# Patient Record
Sex: Female | Born: 1950 | Race: Black or African American | Hispanic: No | Smoking: Never smoker
Health system: Southern US, Community
[De-identification: ages and names within clinical notes are randomized; demographics above are authoritative.]

## PROBLEM LIST (undated history)

## (undated) DIAGNOSIS — G4730 Sleep apnea, unspecified: Secondary | ICD-10-CM

## (undated) DIAGNOSIS — E039 Hypothyroidism, unspecified: Secondary | ICD-10-CM

## (undated) DIAGNOSIS — I10 Essential (primary) hypertension: Secondary | ICD-10-CM

## (undated) DIAGNOSIS — I509 Heart failure, unspecified: Secondary | ICD-10-CM

## (undated) DIAGNOSIS — N189 Chronic kidney disease, unspecified: Secondary | ICD-10-CM

## (undated) DIAGNOSIS — E119 Type 2 diabetes mellitus without complications: Secondary | ICD-10-CM

## (undated) HISTORY — DX: Hypothyroidism, unspecified: E03.9

## (undated) HISTORY — DX: Heart failure, unspecified: I50.9

## (undated) HISTORY — DX: Essential (primary) hypertension: I10

## (undated) HISTORY — PX: HYSTEROSCOPY WITH D & C: SHX1775

## (undated) HISTORY — DX: Sleep apnea, unspecified: G47.30

## (undated) HISTORY — PX: CHOLECYSTECTOMY: SHX55

## (undated) HISTORY — PX: BREAST SURGERY: SHX581

## (undated) HISTORY — PX: VEIN SURGERY: SHX48

## (undated) HISTORY — DX: Chronic kidney disease, unspecified: N18.9

## (undated) HISTORY — PX: AV FISTULA PLACEMENT: SHX1204

## (undated) HISTORY — DX: Type 2 diabetes mellitus without complications: E11.9

---

## 1999-11-19 ENCOUNTER — Encounter: Admission: RE | Admit: 1999-11-19 | Discharge: 2000-02-17 | Payer: Self-pay | Admitting: Family Medicine

## 2011-09-30 DIAGNOSIS — R079 Chest pain, unspecified: Secondary | ICD-10-CM

## 2011-10-01 DIAGNOSIS — R0602 Shortness of breath: Secondary | ICD-10-CM

## 2019-06-28 ENCOUNTER — Ambulatory Visit: Payer: Medicare Other | Admitting: Orthopaedic Surgery

## 2019-06-28 ENCOUNTER — Encounter: Payer: Self-pay | Admitting: Orthopaedic Surgery

## 2019-06-28 ENCOUNTER — Ambulatory Visit: Payer: Medicare Other

## 2019-06-28 ENCOUNTER — Other Ambulatory Visit: Payer: Self-pay

## 2019-06-28 VITALS — Temp 97.9°F | Wt 340.0 lb

## 2019-06-28 DIAGNOSIS — M7062 Trochanteric bursitis, left hip: Secondary | ICD-10-CM

## 2019-06-28 DIAGNOSIS — M25552 Pain in left hip: Secondary | ICD-10-CM | POA: Diagnosis not present

## 2019-06-28 DIAGNOSIS — Z6841 Body Mass Index (BMI) 40.0 and over, adult: Secondary | ICD-10-CM

## 2019-06-28 NOTE — Progress Notes (Signed)
Subjective:    Patient ID: Julie [name], female    DOB: 21-Mar-1951, 69 y.o.   MRN: [id number]  HPI She has had pain in the left lateral hip for over a year.  She has no trauma.  The pain is deep and runs down partially on the left thigh. She has no redness.  She uses a cane.  She had x-rays last year at [facility].  She has notes from her family doctor which I have reviewed.   Review of Systems  Constitutional: Positive for activity change.  Musculoskeletal: Positive for arthralgias, back pain, gait problem and myalgias.  All other systems reviewed and are negative.  For Review of Systems, all other systems reviewed and are negative.  The following is a summary of the past history medically, past history surgically, known current medicines, social history and family history.  This information is gathered electronically by the computer from prior information and documentation.  I review this each visit and have found including this information at this point in the chart is beneficial and informative.   No past medical history on file.    Current Outpatient Medications on File Prior to Visit  Medication Sig Dispense Refill  . albuterol (PROVENTIL) (2.5 MG/3ML) 0.083% nebulizer solution Take 2.5 mg by nebulization every 6 (six) hours as needed for wheezing or shortness of breath.    Marland Kitchen aspirin EC 81 MG tablet Take 81 mg by mouth daily.    . calcitRIOL (ROCALTROL) 0.25 MCG capsule Take 0.25 mcg by mouth daily.    . cholecalciferol (VITAMIN D3) 25 MCG (1000 UNIT) tablet Take 1,000 Units by mouth daily.    . cyclobenzaprine (FLEXERIL) 5 MG tablet Take 5 mg by mouth 3 (three) times daily as needed for muscle spasms.    Marland Kitchen gabapentin (NEURONTIN) 100 MG capsule Take 100 mg by mouth 3 (three) times daily.    . hydrOXYzine (ATARAX/VISTARIL) 25 MG tablet Take 25 mg by mouth.    . insulin aspart (NOVOLOG) 100 UNIT/ML injection Inject into the skin 3 (three) times daily before meals.    .  lipase/protease/amylase (CREON) 12000 units CPEP capsule Take 10,440 Units by mouth.    . pantoprazole (PROTONIX) 40 MG tablet Take 40 mg by mouth daily.    . sodium bicarbonate 650 MG tablet Take 650 mg by mouth 4 (four) times daily.    Marland Kitchen torsemide (DEMADEX) 20 MG tablet Take 20 mg by mouth daily.     No current facility-administered medications on file prior to visit.    Social History   Socioeconomic History  . Marital status: Divorced    Spouse name: Not on file  . Number of children: Not on file  . Years of education: Not on file  . Highest education level: Not on file  Occupational History  . Not on file  Tobacco Use  . Smoking status: Current Every Day Smoker    Types: Cigarettes  . Smokeless tobacco: Never Used  Substance and Sexual Activity  . Alcohol use: Not on file  . Drug use: Not on file  . Sexual activity: Not on file  Other Topics Concern  . Not on file  Social History Narrative  . Not on file   Social Determinants of Health   Financial Resource Strain:   . Difficulty of Paying Living Expenses: Not on file  Food Insecurity:   . Worried About Charity fundraiser in the Last Year: Not on file  . Ran Out  of Food in the Last Year: Not on file  Transportation Needs:   . Lack of Transportation (Medical): Not on file  . Lack of Transportation (Non-Medical): Not on file  Physical Activity:   . Days of Exercise per Week: Not on file  . Minutes of Exercise per Session: Not on file  Stress:   . Feeling of Stress : Not on file  Social Connections:   . Frequency of Communication with Friends and Family: Not on file  . Frequency of Social Gatherings with Friends and Family: Not on file  . Attends Religious Services: Not on file  . Active Member of Clubs or Organizations: Not on file  . Attends Archivist Meetings: Not on file  . Marital Status: Not on file  Intimate Partner Violence:   . Fear of Current or Ex-Partner: Not on file  . Emotionally  Abused: Not on file  . Physically Abused: Not on file  . Sexually Abused: Not on file    History of heart disease in the family.  Temp 97.9 F (36.6 C)   Wt (!) 340 lb (154.2 kg)   There is no height or weight on file to calculate BMI.     Objective:   Physical Exam Vitals and nursing note reviewed.  Constitutional:      Appearance: She is well-developed.  HENT:     Head: Normocephalic and atraumatic.  Eyes:     Conjunctiva/sclera: Conjunctivae normal.     Pupils: Pupils are equal, round, and reactive to light.  Cardiovascular:     Rate and Rhythm: Normal rate and regular rhythm.  Pulmonary:     Effort: Pulmonary effort is normal.  Abdominal:     Palpations: Abdomen is soft.  Musculoskeletal:     Cervical back: Normal range of motion and neck supple.       Legs:  Skin:    General: Skin is warm and dry.  Neurological:     Mental Status: She is alert and oriented to person, place, and time.     Cranial Nerves: No cranial nerve deficit.     Motor: No abnormal muscle tone.     Coordination: Coordination normal.     Deep Tendon Reflexes: Reflexes are normal and symmetric. Reflexes normal.  Psychiatric:        Behavior: Behavior normal.        Thought Content: Thought content normal.        Judgment: Judgment normal.    X-rays were done of the pelvis, reported separately.       Assessment & Plan:   Encounter Diagnoses  Name Primary?  . Pain in left hip Yes  . Body mass index 45.0-49.9, adult ([place])   . Morbid obesity ([place])   . Trochanteric bursitis, left hip    PROCEDURE NOTE:  The patient request injection, verbal consent was obtained.  The left trochanteric area of the hip was prepped appropriately after time out was performed.   Sterile technique was observed and injection of 1 cc of Depo-Medrol 40 mg with several cc's of plain xylocaine. Anesthesia was provided by ethyl chloride and a 20-gauge needle was used to inject the hip area. The injection was  tolerated well.  A band aid dressing was applied.  The patient was advised to apply ice later today and tomorrow to the injection sight as needed.  Return in two weeks.  Call if any problem.  Precautions discussed.   Electronically Signed Sanjuana Kava, MD 3/9/20212:37 PM

## 2019-07-12 ENCOUNTER — Other Ambulatory Visit: Payer: Self-pay

## 2019-07-12 ENCOUNTER — Encounter: Payer: Self-pay | Admitting: Orthopaedic Surgery

## 2019-07-12 ENCOUNTER — Ambulatory Visit: Payer: Medicare Other | Admitting: Orthopaedic Surgery

## 2019-07-12 VITALS — BP 145/69 | HR 87 | Temp 98.1°F | Ht 68.0 in | Wt 332.0 lb

## 2019-07-12 DIAGNOSIS — M25552 Pain in left hip: Secondary | ICD-10-CM | POA: Diagnosis not present

## 2019-07-12 DIAGNOSIS — Z6841 Body Mass Index (BMI) 40.0 and over, adult: Secondary | ICD-10-CM

## 2019-07-12 NOTE — Progress Notes (Signed)
My hip is so much better  The injection to the left trochanteric area of the hip helped very much.  She has no pain today, normal gait.  NV intact.  Encounter Diagnoses  Name Primary?  . Pain in left hip Yes  . Body mass index 45.0-49.9, adult ([place])   . Morbid obesity ([place])    I will see as needed.  Call if any problem.  Precautions discussed.   Electronically Signed Sanjuana Kava, MD 3/23/20212:10 PM

## 2019-08-19 NOTE — Progress Notes (Signed)
Office Visit Note  Patient: Julie [name]             Date of Birth: 1950/11/02           MRN: [id number]             PCP: Rosita Fire, MD Referring: Rosita Fire, MD Visit Date: 08/26/2019 Occupation: @GUAROCC @  Subjective:  Pain in multiple joints.   History of Present Illness: Julie [name] is a 69 y.o. female seen in consultation per request of her PCP.  According to patient her symptoms started in 2008 when she was diagnosed with breast cancer she was started experiencing increased joint stiffness.  She states the joint pain and stiffness continued at about 2 years ago she was seen in the emergency room at [facility] where she was diagnosed with trochanteric bursitis.  She continues to have pain and discomfort in her knee joints, her ankles and her feet.  She also has intermittent swelling in her feet.  She states her muscles in her lower extremities are painful.  She occasionally have cramps in her upper extremities but no joint swelling.  She was recently seen by her orthopedic surgeon Dr. Luna Glasgow who gave her cortisone injection for trochanteric bursitis which was helpful.  There is no family history of autoimmune disease.  Activities of Daily Living:  Patient reports morning stiffness for 1 hour.   Patient Reports nocturnal pain.  Difficulty dressing/grooming: Denies Difficulty climbing stairs: Reports Difficulty getting out of chair: Reports Difficulty using hands for taps, buttons, cutlery, and/or writing: Reports  Review of Systems  Constitutional: Positive for fatigue. Negative for night sweats, weight gain and weight loss.  HENT: Negative for mouth sores, trouble swallowing, trouble swallowing, mouth dryness and nose dryness.   Eyes: Positive for dryness. Negative for pain, redness and visual disturbance.  Respiratory: Positive for shortness of breath. Negative for cough and difficulty breathing.        History of CHF  Cardiovascular: Negative for chest  pain, palpitations, hypertension, irregular heartbeat and swelling in legs/feet.  Gastrointestinal: Positive for constipation and diarrhea. Negative for blood in stool.  Endocrine: Negative for increased urination.  Genitourinary: Negative for difficulty urinating, painful urination and vaginal dryness.  Musculoskeletal: Positive for arthralgias, gait problem, joint pain, myalgias, muscle weakness, morning stiffness and myalgias. Negative for joint swelling and muscle tenderness.  Skin: Negative for color change, rash, hair loss, redness, skin tightness, ulcers and sensitivity to sunlight.  Allergic/Immunologic: Negative for susceptible to infections.  Neurological: Negative for dizziness, numbness, headaches, memory loss, night sweats and weakness.  Hematological: Negative for bruising/bleeding tendency and swollen glands.  Psychiatric/Behavioral: Positive for sleep disturbance. Negative for depressed mood and confusion. The patient is not nervous/anxious.     PMFS History:  Patient Active Problem List   Diagnosis Date Noted  . CKD (chronic kidney disease) stage 4, GFR 15-29 ml/min (HCC) 08/26/2019  . OSA (obstructive sleep apnea) 08/26/2019  . History of hypothyroidism 08/26/2019  . History of type 2 diabetes mellitus 08/26/2019  . Hypertensive heart disease with chronic systolic congestive heart failure ([place]) 08/26/2019  . Chronic systolic CHF (congestive heart failure) ([place]) 08/26/2019  . History of breast cancer 08/26/2019  . History of gastroesophageal reflux (GERD) 08/26/2019    Past Medical History:  Diagnosis Date  . CHF (congestive heart failure) ([place])   . CKD (chronic kidney disease)   . Diabetes mellitus without complication ([place])   . Hypertension   . Hypothyroidism   . Sleep  apnea     Family History  Problem Relation Age of Onset  . Diabetes Mother   . Diabetes Father   . Diabetes Brother   . Diabetes Sister   . Diabetes Sister   . Thyroid disease Sister   .  Diabetes Son   . Healthy Son   . Healthy Son   . Healthy Son   . Healthy Daughter    Past Surgical History:  Procedure Laterality Date  . BREAST SURGERY Bilateral    mastectomy   . CHOLECYSTECTOMY    . HYSTEROSCOPY WITH D & C     x2  . VEIN SURGERY Left    vein transferred    Social History   Social History Narrative  . Not on file    There is no immunization history on file for this patient.   Objective: Vital Signs: BP 133/71 (BP Location: Right Wrist, Patient Position: Sitting, Cuff Size: Normal)   Pulse 82   Resp 18   Ht 5\' 8"  (1.727 m)   Wt (!) 356 lb (161.5 kg)   BMI 54.13 kg/m    Physical Exam Vitals and nursing note reviewed.  Constitutional:      Appearance: She is well-developed.  HENT:     Head: Normocephalic and atraumatic.  Eyes:     Conjunctiva/sclera: Conjunctivae normal.  Cardiovascular:     Rate and Rhythm: Normal rate and regular rhythm.     Heart sounds: Normal heart sounds.  Pulmonary:     Effort: Pulmonary effort is normal.     Breath sounds: Normal breath sounds.  Abdominal:     General: Bowel sounds are normal.     Palpations: Abdomen is soft.  Musculoskeletal:     Cervical back: Normal range of motion.  Lymphadenopathy:     Cervical: No cervical adenopathy.  Skin:    General: Skin is warm and dry.     Capillary Refill: Capillary refill takes less than 2 seconds.  Neurological:     Mental Status: She is alert and oriented to person, place, and time.  Psychiatric:        Behavior: Behavior normal.      Musculoskeletal Exam: C-spine was in good range of motion.  Thoracic and lumbar spine were difficult to assess due to limited mobility.  Shoulder joints, elbow joints, wrist joints, MCPs, PIPs and DIPs with good range of motion with no synovitis.  Hip joints were difficult to assess but patient declined any discomfort in her hips.  She has discomfort in bilateral knee joints but no warmth swelling or effusion was noted.  Bilateral  ankle joints with good range of motion.  She has some tenderness at the base of her right MTP and midfoot.  Left foot did not show any synovitis or discomfort.  CDAI Exam: CDAI Score: -- Patient Global: --; Provider Global: -- Swollen: --; Tender: -- Joint Exam 08/26/2019   No joint exam has been documented for this visit   There is currently no information documented on the homunculus. Go to the Rheumatology activity and complete the homunculus joint exam.  Investigation: No additional findings.  Imaging: No results found.  Recent Labs: No results found for: WBC, HGB, PLT, NA, K, CL, CO2, GLUCOSE, BUN, CREATININE, BILITOT, ALKPHOS, AST, ALT, PROT, ALBUMIN, CALCIUM, GFRAA, QFTBGOLD, QFTBGOLDPLUS  Speciality Comments: No specialty comments available.  Procedures:  No procedures performed Allergies: Ace inhibitors and Lortab [hydrocodone-acetaminophen]   Assessment / Plan:     Visit Diagnoses: Chronic pain of  both knees -patient complains of pain and discomfort in her bilateral knee joints.  No warmth swelling or effusion was noted.  Plan: XR KNEE 3 VIEW RIGHT, XR KNEE 3 VIEW LEFT.  X-rays of bilateral knee joints were consistent with severe osteoarthritis and severe chondromalacia patella.  She is having problems with mobility due to severe osteoarthritis.  She will eventually need total knee replacement.  Pain in both feet -she complains of pain and discomfort in her feet.  She had tenderness on palpation of her right first MTP joint.  No synovitis was noted.  Plan: XR Foot 2 Views Right, XR Foot 2 Views Left,.  X-ray findings are consistent with osteoarthritis.  Rheumatoid factor, Cyclic citrul peptide antibody, IgG, 14-3-3 eta Protein, Uric acid, Sedimentation rate  Myalgia -she complains of increased muscle cramps and left thigh tenderness.  She was recently injected by her orthopedic surgeon for left trochanteric bursitis.  Plan: CK   She has multiple medical problems which are  listed as follows:   Chronic systolic CHF (congestive heart failure) ([place])  Hypertensive heart disease with chronic systolic congestive heart failure ([place])  History of type 2 diabetes mellitus  CKD (chronic kidney disease) stage 4, GFR 15-29 ml/min (HCC)  History of hypothyroidism  OSA (obstructive sleep apnea)  History of breast cancer - 2008, bilateral mastectomy status post chemotherapy.  Followed at [facility].  History of gastroesophageal reflux (GERD)  Secondary hyperparathyroidism ([place])  Anemia due to stage 5 chronic kidney disease, not on chronic dialysis ([place])  A-V fistula (HCC) - Left arm  Orders: Orders Placed This Encounter  Procedures  . XR KNEE 3 VIEW RIGHT  . XR KNEE 3 VIEW LEFT  . XR Foot 2 Views Right  . XR Foot 2 Views Left  . Rheumatoid factor  . Cyclic citrul peptide antibody, IgG  . 14-3-3 eta Protein  . Uric acid  . Sedimentation rate  . CK   No orders of the defined types were placed in this encounter.   Face-to-face time spent with patient was 45 minutes. Greater than 50% of time was spent in counseling and coordination of care.  Follow-Up Instructions: Return for pain in knees and feet.   Bo Merino, MD  Note - This record has been created using Editor, commissioning.  Chart creation errors have been sought, but may not always  have been located. Such creation errors do not reflect on  the standard of medical care.

## 2019-08-26 ENCOUNTER — Ambulatory Visit: Payer: Self-pay

## 2019-08-26 ENCOUNTER — Encounter (INDEPENDENT_AMBULATORY_CARE_PROVIDER_SITE_OTHER): Payer: Self-pay

## 2019-08-26 ENCOUNTER — Ambulatory Visit: Payer: Medicare Other | Admitting: Rheumatology

## 2019-08-26 ENCOUNTER — Other Ambulatory Visit: Payer: Self-pay

## 2019-08-26 ENCOUNTER — Encounter: Payer: Self-pay | Admitting: Rheumatology

## 2019-08-26 VITALS — BP 133/71 | HR 82 | Resp 18 | Ht 68.0 in | Wt 356.0 lb

## 2019-08-26 DIAGNOSIS — M79671 Pain in right foot: Secondary | ICD-10-CM | POA: Diagnosis not present

## 2019-08-26 DIAGNOSIS — I5022 Chronic systolic (congestive) heart failure: Secondary | ICD-10-CM

## 2019-08-26 DIAGNOSIS — M069 Rheumatoid arthritis, unspecified: Secondary | ICD-10-CM

## 2019-08-26 DIAGNOSIS — D631 Anemia in chronic kidney disease: Secondary | ICD-10-CM

## 2019-08-26 DIAGNOSIS — M25562 Pain in left knee: Secondary | ICD-10-CM

## 2019-08-26 DIAGNOSIS — G8929 Other chronic pain: Secondary | ICD-10-CM | POA: Diagnosis not present

## 2019-08-26 DIAGNOSIS — M791 Myalgia, unspecified site: Secondary | ICD-10-CM | POA: Diagnosis not present

## 2019-08-26 DIAGNOSIS — M79672 Pain in left foot: Secondary | ICD-10-CM | POA: Diagnosis not present

## 2019-08-26 DIAGNOSIS — M25561 Pain in right knee: Secondary | ICD-10-CM

## 2019-08-26 DIAGNOSIS — Z853 Personal history of malignant neoplasm of breast: Secondary | ICD-10-CM

## 2019-08-26 DIAGNOSIS — Z8719 Personal history of other diseases of the digestive system: Secondary | ICD-10-CM

## 2019-08-26 DIAGNOSIS — N184 Chronic kidney disease, stage 4 (severe): Secondary | ICD-10-CM

## 2019-08-26 DIAGNOSIS — G4733 Obstructive sleep apnea (adult) (pediatric): Secondary | ICD-10-CM

## 2019-08-26 DIAGNOSIS — N185 Chronic kidney disease, stage 5: Secondary | ICD-10-CM

## 2019-08-26 DIAGNOSIS — Z8639 Personal history of other endocrine, nutritional and metabolic disease: Secondary | ICD-10-CM

## 2019-08-26 DIAGNOSIS — I110 Hypertensive heart disease with heart failure: Secondary | ICD-10-CM

## 2019-08-26 DIAGNOSIS — I770 Arteriovenous fistula, acquired: Secondary | ICD-10-CM | POA: Insufficient documentation

## 2019-08-26 DIAGNOSIS — N2581 Secondary hyperparathyroidism of renal origin: Secondary | ICD-10-CM

## 2019-08-29 ENCOUNTER — Other Ambulatory Visit: Payer: Self-pay | Admitting: Rheumatology

## 2019-08-29 DIAGNOSIS — R700 Elevated erythrocyte sedimentation rate: Secondary | ICD-10-CM

## 2019-08-29 NOTE — Progress Notes (Signed)
Please add SPEP.  Patient has elevated sedimentation rate.  She did not have inflammatory arthritis on the examination.

## 2019-08-30 ENCOUNTER — Encounter: Payer: Self-pay | Admitting: Rheumatology

## 2019-08-30 NOTE — Telephone Encounter (Signed)
Scheduled for 08/31/2019 @ 2:00 pm.

## 2019-08-30 NOTE — Telephone Encounter (Signed)
Please schedule an earlier appointment.

## 2019-08-31 ENCOUNTER — Other Ambulatory Visit: Payer: Self-pay

## 2019-08-31 ENCOUNTER — Ambulatory Visit: Payer: Medicare Other | Admitting: Rheumatology

## 2019-08-31 ENCOUNTER — Encounter: Payer: Self-pay | Admitting: Rheumatology

## 2019-08-31 ENCOUNTER — Ambulatory Visit (HOSPITAL_COMMUNITY)
Admission: RE | Admit: 2019-08-31 | Discharge: 2019-08-31 | Disposition: A | Discharge status: Home / Self Care | Payer: Medicare Other | Source: Ambulatory Visit | Attending: Rheumatology | Admitting: Rheumatology

## 2019-08-31 VITALS — BP 106/77 | HR 101 | Resp 18 | Ht 68.0 in | Wt 362.0 lb

## 2019-08-31 DIAGNOSIS — Z853 Personal history of malignant neoplasm of breast: Secondary | ICD-10-CM

## 2019-08-31 DIAGNOSIS — M0579 Rheumatoid arthritis with rheumatoid factor of multiple sites without organ or systems involvement: Secondary | ICD-10-CM

## 2019-08-31 DIAGNOSIS — Z79899 Other long term (current) drug therapy: Secondary | ICD-10-CM

## 2019-08-31 DIAGNOSIS — R700 Elevated erythrocyte sedimentation rate: Secondary | ICD-10-CM

## 2019-08-31 DIAGNOSIS — I770 Arteriovenous fistula, acquired: Secondary | ICD-10-CM

## 2019-08-31 DIAGNOSIS — G4733 Obstructive sleep apnea (adult) (pediatric): Secondary | ICD-10-CM

## 2019-08-31 DIAGNOSIS — E790 Hyperuricemia without signs of inflammatory arthritis and tophaceous disease: Secondary | ICD-10-CM

## 2019-08-31 DIAGNOSIS — M19071 Primary osteoarthritis, right ankle and foot: Secondary | ICD-10-CM | POA: Diagnosis not present

## 2019-08-31 DIAGNOSIS — Z8639 Personal history of other endocrine, nutritional and metabolic disease: Secondary | ICD-10-CM

## 2019-08-31 DIAGNOSIS — N184 Chronic kidney disease, stage 4 (severe): Secondary | ICD-10-CM

## 2019-08-31 DIAGNOSIS — Z8719 Personal history of other diseases of the digestive system: Secondary | ICD-10-CM

## 2019-08-31 DIAGNOSIS — I5022 Chronic systolic (congestive) heart failure: Secondary | ICD-10-CM

## 2019-08-31 DIAGNOSIS — M791 Myalgia, unspecified site: Secondary | ICD-10-CM

## 2019-08-31 DIAGNOSIS — I110 Hypertensive heart disease with heart failure: Secondary | ICD-10-CM

## 2019-08-31 DIAGNOSIS — N185 Chronic kidney disease, stage 5: Secondary | ICD-10-CM

## 2019-08-31 DIAGNOSIS — N2581 Secondary hyperparathyroidism of renal origin: Secondary | ICD-10-CM

## 2019-08-31 DIAGNOSIS — M170 Bilateral primary osteoarthritis of knee: Secondary | ICD-10-CM

## 2019-08-31 DIAGNOSIS — D631 Anemia in chronic kidney disease: Secondary | ICD-10-CM

## 2019-08-31 DIAGNOSIS — M19072 Primary osteoarthritis, left ankle and foot: Secondary | ICD-10-CM

## 2019-08-31 MED ORDER — PREDNISONE 5 MG PO TABS
ORAL_TABLET | ORAL | 0 refills | Status: DC
Start: 2019-08-31 — End: 2020-08-15

## 2019-08-31 NOTE — Patient Instructions (Signed)
Abatacept solution for injection (subcutaneous or intravenous use) What is this medicine? ABATACEPT (a ba TA sept) is used to treat moderate to severe active rheumatoid arthritis or psoriatic arthritis in adults. This medicine is also used to treat juvenile idiopathic arthritis. This medicine may be used for other purposes; ask your health care provider or pharmacist if you have questions. COMMON BRAND NAME(S): Orencia What should I tell my health care provider before I take this medicine? They need to know if you have any of these conditions:  cancer  diabetes  hepatitis B or history of hepatitis B infection  immune system problems  infection or history of infection (especially a virus infection such as chickenpox, cold sores, or herpes)  lung or breathing problems, like chronic obstructive pulmonary disease (COPD)  recently received or scheduled to receive a vaccination  scheduled to have surgery  tuberculosis, a positive skin test for tuberculosis, or have recently been in close contact with someone who has tuberculosis  an unusual or allergic reaction to abatacept, other medicines, foods, dyes, or preservatives  pregnant or trying to get pregnant  breast-feeding How should I use this medicine? This medicine is for infusion into a vein or for injection under the skin. Infusions are given by a health care professional in a hospital or clinic setting. If you are to give your own medicine at home, you will be taught how to prepare and give this medicine under the skin. Use exactly as directed. Take your medicine at regular intervals. Do not take your medicine more often than directed. It is important that you put your used needles and syringes in a special sharps container. Do not put them in a trash can. If you do not have a sharps container, call your pharmacist or health care provider to get one. Talk to your pediatrician regarding the use of this medicine in children. While  infusions in a clinic may be prescribed for children as young as 2 years for selected conditions, precautions do apply. Overdosage: If you think you have taken too much of this medicine contact a poison control center or emergency room at once. NOTE: This medicine is only for you. Do not share this medicine with others. What if I miss a dose? This medicine is used once a week if given by injection under the skin. If you miss a dose, take it as soon as you can. If it is almost time for your next dose, take only that dose. Do not take double or extra doses. If you are to be given an infusion of this medicine, it is important not to miss your dose. Doses are usually every 4 weeks. Call your doctor or health care professional if you are unable to keep an appointment. What may interact with this medicine? Do not take this medicine with any of the following medications:  live vaccines This medicine may also interact with the following medications:  anakinra  baricitinib  canakinumab  medicines that lower your chance of fighting an infection  rituximab  TNF blockers such as adalimumab, certolizumab, etanercept, golimumab, infliximab  tocilizumab  tofacitinib  upadacitinib  ustekinumab This list may not describe all possible interactions. Give your health care provider a list of all the medicines, herbs, non-prescription drugs, or dietary supplements you use. Also tell them if you smoke, drink alcohol, or use illegal drugs. Some items may interact with your medicine. What should I watch for while using this medicine? Visit your doctor for regular checks on your   progress. Tell your doctor or health care professional if your symptoms do not start to get better or if they get worse. You will be tested for tuberculosis (TB) before you start this medicine. If your doctor prescribed any medicine for TB, you should start taking the TB medicine before starting this medicine. Make sure to finish the  full course of TB medicine. This medicine may increase your risk of getting an infection. Call your doctor or health care professional if you get fever, chills, or sore throat, or other symptoms of a cold or flu. Do not treat yourself. Try to avoid being around people who are sick. If you have diabetes and are getting this medicine in a vein, the infusion can give false high blood sugar readings on the day of your dose. This may happen if you use certain types of blood glucose tests. Your health care provider may tell you to use a different way to monitor your blood sugar levels. What side effects may I notice from receiving this medicine? Side effects that you should report to your doctor or health care professional as soon as possible:  allergic reactions like skin rash, itching or hives, swelling of the face, lips, or tongue  breathing problems  chest pain  dizziness  signs and symptoms of infection like fever; chills; cough; sore throat; pain or trouble passing urine  unusually weak or tired Side effects that usually do not require medical attention (report to your doctor or health care professional if they continue or are bothersome):  diarrhea  headache  nausea  pain, redness, or irritation at site where injected  stomach pain or upset This list may not describe all possible side effects. Call your doctor for medical advice about side effects. You may report side effects to FDA at 1-800-FDA-1088. Where should I keep my medicine? Infusions will be given in a hospital or clinic and will not be stored at home. Storage for syringes and autoinjectors stored at home: Keep out of the reach of children. Store in a refrigerator between 2 and 8 degrees C (36 and 46 degrees F). Keep this medicine in the original container. Protect from light. Do not freeze. Do not shake. Throw away any unused medicine after the expiration date. NOTE: This sheet is a summary. It may not cover all possible  information. If you have questions about this medicine, talk to your doctor, pharmacist, or health care provider.  2020 Elsevier/Gold Standard (2018-10-12 14:01:21)  

## 2019-08-31 NOTE — Progress Notes (Signed)
Office Visit Note  Patient: Julie [name]             Date of Birth: 08/12/1950           MRN: [id number]             PCP: Rosita Fire, MD Referring: Rosita Fire, MD Visit Date: 08/31/2019 Occupation: '@GUAROCC'$ @  Subjective:  Pain and swelling in joints.   History of Present Illness: Julie [name] is a 69 y.o. female with history of pain in multiple joints.  She states she has had intermittent swelling for the last few years since she has been retired.  She was seen today in urgent basis that she developed pain and swelling in her hand to the point she was having difficulty making a fist.  She states the hand swelling has been better but now she is having discomfort in her left foot.  She states she has taken prednisone in the past for these episodes and the joint symptoms get better.  Patient states usually she gets prednisone taper for these episodes.  Activities of Daily Living:  Patient reports morning stiffness for 24 hours.   Patient Reports nocturnal pain.  Difficulty dressing/grooming: Denies Difficulty climbing stairs: Reports Difficulty getting out of chair: Reports Difficulty using hands for taps, buttons, cutlery, and/or writing: Reports  Review of Systems  Constitutional: Positive for fatigue.  HENT: Positive for nose dryness. Negative for mouth sores and mouth dryness.   Eyes: Positive for dryness. Negative for itching.  Respiratory: Negative for shortness of breath and difficulty breathing.   Cardiovascular: Negative for chest pain and palpitations.  Gastrointestinal: Negative for blood in stool, constipation and diarrhea.  Endocrine: Negative for increased urination.  Genitourinary: Negative for difficulty urinating and painful urination.  Musculoskeletal: Positive for arthralgias, joint pain, joint swelling, myalgias, morning stiffness, muscle tenderness and myalgias.  Skin: Negative for rash, hair loss and redness.  Allergic/Immunologic: Negative for  susceptible to infections.  Neurological: Positive for weakness. Negative for dizziness, numbness, headaches and memory loss.  Hematological: Negative for bruising/bleeding tendency.  Psychiatric/Behavioral: Negative for confusion.    PMFS History:  Patient Active Problem List   Diagnosis Date Noted  . CKD (chronic kidney disease) stage 4, GFR 15-29 ml/min (HCC) 08/26/2019  . OSA (obstructive sleep apnea) 08/26/2019  . History of hypothyroidism 08/26/2019  . History of type 2 diabetes mellitus 08/26/2019  . Hypertensive heart disease with chronic systolic congestive heart failure ([place]) 08/26/2019  . Chronic systolic CHF (congestive heart failure) ([place]) 08/26/2019  . History of breast cancer 08/26/2019  . History of gastroesophageal reflux (GERD) 08/26/2019  . Secondary hyperparathyroidism ([place]) 08/26/2019  . Anemia due to stage 5 chronic kidney disease, not on chronic dialysis ([place]) 08/26/2019  . A-V fistula ([place]) 08/26/2019    Past Medical History:  Diagnosis Date  . CHF (congestive heart failure) ([place])   . CKD (chronic kidney disease)   . Diabetes mellitus without complication ([place])   . Hypertension   . Hypothyroidism   . Sleep apnea     Family History  Problem Relation Age of Onset  . Diabetes Mother   . Diabetes Father   . Diabetes Brother   . Diabetes Sister   . Diabetes Sister   . Thyroid disease Sister   . Diabetes Son   . Healthy Son   . Healthy Son   . Healthy Son   . Healthy Daughter    Past Surgical History:  Procedure Laterality Date  . BREAST  SURGERY Bilateral    mastectomy   . CHOLECYSTECTOMY    . HYSTEROSCOPY WITH D & C     x2  . VEIN SURGERY Left    vein transferred    Social History   Social History Narrative  . Not on file    There is no immunization history on file for this patient.   Objective: Vital Signs: BP 106/77 (BP Location: Left Wrist, Patient Position: Sitting, Cuff Size: Normal)   Pulse (!) 101   Resp 18   Ht '5\' 8"'$  (1.727  m)   Wt (!) 362 lb (164.2 kg)   BMI 55.04 kg/m    Physical Exam Vitals and nursing note reviewed.  Constitutional:      Appearance: She is well-developed.  HENT:     Head: Normocephalic and atraumatic.  Eyes:     Conjunctiva/sclera: Conjunctivae normal.  Cardiovascular:     Rate and Rhythm: Normal rate and regular rhythm.     Heart sounds: Normal heart sounds.  Pulmonary:     Effort: Pulmonary effort is normal.     Breath sounds: Normal breath sounds.  Abdominal:     General: Bowel sounds are normal.     Palpations: Abdomen is soft.  Musculoskeletal:     Cervical back: Normal range of motion.  Lymphadenopathy:     Cervical: No cervical adenopathy.  Skin:    General: Skin is warm and dry.     Capillary Refill: Capillary refill takes less than 2 seconds.  Neurological:     Mental Status: She is alert and oriented to person, place, and time.  Psychiatric:        Behavior: Behavior normal.      Musculoskeletal Exam: C-spine was in good range of motion.  Shoulder joints elbow joints wrist joints with good range of motion.  She had good range of motion of her wrist joints and MCP joints.  She is swelling over her right fifth PIP joint.  She has warmth and tenderness with range of motion of her bilateral knee joints.  She has swelling over her left ankle joint. CDAI Exam: CDAI Score: 5  Patient Global: 5 mm; Provider Global: 5 mm Swollen: 2 ; Tender: 4  Joint Exam 08/31/2019      Right  Left  PIP 5  Swollen Tender     Knee   Tender   Tender  Ankle     Swollen Tender     Investigation: No additional findings.  Imaging: XR Foot 2 Views Left  Result Date: 08/26/2019 First MTP, PIP and DIP narrowing was noted.  No intertarsal or tibiotalar joint space narrowing was noted.  Inferior calcaneal spur was noted.  No erosive changes were noted. Impression: These findings are consistent with osteoarthritis of the foot.  XR Foot 2 Views Right  Result Date: 08/26/2019 First MTP,  PIP and DIP narrowing was noted.  No intertarsal or tibiotalar joint space narrowing was noted.  Inferior calcaneal spur was noted.  No erosive changes were noted. Impression: These findings are consistent with osteoarthritis of the foot.  XR KNEE 3 VIEW LEFT  Result Date: 08/26/2019 Severe lateral compartment narrowing with lateral and intercondylar osteophytes was noted.  No chondrocalcinosis was noted.  Severe patellofemoral narrowing was noted. Impression: These findings are consistent with severe osteoarthritis and severe chondromalacia patella.  XR KNEE 3 VIEW RIGHT  Result Date: 08/26/2019 Severe lateral compartment narrowing with lateral and intercondylar osteophytes was noted.  No chondrocalcinosis was noted.  Severe patellofemoral  narrowing was noted. Impression: These findings are consistent with severe osteoarthritis and severe chondromalacia patella.   Recent Labs: No results found for: WBC, HGB, PLT, NA, K, CL, CO2, GLUCOSE, BUN, CREATININE, BILITOT, ALKPHOS, AST, ALT, PROT, ALBUMIN, CALCIUM, GFRAA, QFTBGOLD, QFTBGOLDPLUS   Aug 26, 2019 RF 21, anti-CCP negative, ESR 125, uric acid 9.8, CK 47  August 02, 2019 labs from [facility] CBC WBC 8.9 hemoglobin 10.7 platelets 266, CMP creatinine 4.2, glucose 182, protein 8.4, May 10, 2019 TSH normal  Speciality Comments: No specialty comments available.  Procedures:  No procedures performed Allergies: Ace inhibitors and Lortab [hydrocodone-acetaminophen]   Assessment / Plan:     Visit Diagnoses: Rheumatoid arthritis involving multiple sites with positive rheumatoid factor (HCC) - RF 21, anti-CCP negative, ESR 125 -patient gives history of intermittent swelling in multiple joints for the last few years.  The x-rays obtained at the last visit were unremarkable.  Her sed rate is 125 and rheumatoid factor is low titer positive.  Anti-CCP is negative.  She came today in urgent basis due to increased pain and swelling.  She  had swelling in multiple joints as described above.  I discussed the most likely diagnosis of rheumatoid arthritis.  We also discussed treatment options.  With her history of severe kidney disease and congestive heart failure the choices are very limited.  I will obtain screening labs today.  If all the labs are within normal limits then we may be able to start her on Orencia if she agrees.  She is taking the information with her.  She stated that her episodes are not so frequent and she is satisfied taking prednisone intermittently.  She is concerned about the increased risk of infection with Orencia.  Have given her a handout to think about it.  I will call in prednisone 20 mg which can be tapered by 5 mg every 4 days.  She has sliding scale and she states that she will be able to monitor her blood sugar and adjust the insulin dosage.  Plan: 14-3-3 eta Protein  High risk medication use - Plan: BASIC METABOLIC PANEL WITH GFR, Hepatitis A antibody, IgM, Hepatitis B core antibody, IgM, Hepatitis B surface antigen, HIV Antibody (routine testing w rflx), QuantiFERON-TB Gold Plus, Serum protein electrophoresis with reflex, IgG, IgA, IgM, Thiopurine methyltransferase(tpmt)rbc, DG Chest 2 View  Elevated sedimentation rate-I am uncertain about the true etiology of elevated sed rate of 125.  She does not have that much inflammation.  She also has anemia due to chronic kidney disease that may be elevating her sed rate.  I do not find any previous sed rate for comparison.  Dr. Legrand Rams was not available in the office today.  I will try to reach him tomorrow.  She may need work-up to evaluate elevated sedimentation rate.  Primary osteoarthritis of both feet-she did have osteoarthritic changes on her x-rays obtained last visit.  Today she presented with left ankle joint swelling.  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  She has difficulty with mobility.   She also has warmth and swelling in her knee joints.  Hyperuricemia - Uric acid 9.8.  She denies any history of gout.  Myalgia - CK was normal.  Hypertensive heart disease with chronic systolic congestive heart failure (HCC)  Chronic systolic CHF (congestive heart failure) (HCC)  History of type 2 diabetes mellitus  CKD (chronic kidney disease) stage 4, GFR 15-29 ml/min (HCC)  A-V fistula ([place])  Anemia due to stage 5 chronic kidney disease, not on chronic dialysis ([place])  History of breast cancer  Secondary hyperparathyroidism ([place])  History of gastroesophageal reflux (GERD)  History of hypothyroidism  OSA (obstructive sleep apnea)  Orders: Orders Placed This Encounter  Procedures  . DG Chest 2 View  . BASIC METABOLIC PANEL WITH GFR  . 14-3-3 eta Protein  . Hepatitis A antibody, IgM  . Hepatitis B core antibody, IgM  . Hepatitis B surface antigen  . HIV Antibody (routine testing w rflx)  . QuantiFERON-TB Gold Plus  . Serum protein electrophoresis with reflex  . IgG, IgA, IgM  . Thiopurine methyltransferase(tpmt)rbc   No orders of the defined types were placed in this encounter.   Face-to-face time spent with patient was 35 minutes. Greater than 50% of time was spent in counseling and coordination of care.  Follow-Up Instructions: Return for Rheumatoid arthritis.   Bo Merino, MD  Note - This record has been created using Editor, commissioning.  Chart creation errors have been sought, but may not always  have been located. Such creation errors do not reflect on  the standard of medical care.

## 2019-09-01 LAB — 14-3-3 ETA PROTEIN: 14-3-3 eta Protein: <0.2 ng/mL (ref <0.2)

## 2019-09-01 LAB — RHEUMATOID FACTOR: Rheumatoid fact SerPl-aCnc: 21 IU/mL — ABNORMAL HIGH (ref <14)

## 2019-09-01 LAB — URIC ACID: Uric Acid, Serum: 9.8 mg/dL — ABNORMAL HIGH (ref 2.5–7.0)

## 2019-09-01 LAB — CYCLIC CITRUL PEPTIDE ANTIBODY, IGG: Cyclic Citrullin Peptide Ab: <16 UNITS

## 2019-09-01 LAB — SEDIMENTATION RATE: Sed Rate: 125 mm/h — ABNORMAL HIGH (ref 0–30)

## 2019-09-01 LAB — CK: Total CK: 47 U/L (ref 29–143)

## 2019-09-02 NOTE — Progress Notes (Signed)
Rheumatoid factor is low titer positive, uric acid is elevated but she had no features of gout.

## 2019-09-02 NOTE — Progress Notes (Signed)
There is no active lung disease.  Mild heart enlargement is noted.  There is also prominence of pulmonary vessels.  She has history of congestive heart failure.  Please forward the report to her cardiologist and PCP.  Please notify patient of the x-ray results.

## 2019-09-05 ENCOUNTER — Telehealth: Payer: Self-pay | Admitting: Rheumatology

## 2019-09-05 NOTE — Telephone Encounter (Signed)
Dr. Legrand Rams returned my call.  I discussed elevated sedimentation rate of 125.  We discussed possible work-up including CT abdomen pelvis and total body bone scan.  He would prefer to wait at this time which I was in agreement and see if her sed rate goes down after her rheumatoid arthritis is better controlled.  We will also review the chart to see if she is up-to-date on mammogram and colonoscopy. Bo Merino, MD

## 2019-09-07 LAB — 14-3-3 ETA PROTEIN: 14-3-3 eta Protein: <0.2 ng/mL (ref <0.2)

## 2019-09-07 LAB — PROTEIN ELECTROPHORESIS, SERUM, WITH REFLEX
Albumin ELP: 3.5 g/dL — ABNORMAL LOW (ref 3.8–4.8)
Alpha 1: 0.3 g/dL (ref 0.2–0.3)
Alpha 2: 1.1 g/dL — ABNORMAL HIGH (ref 0.5–0.9)
Beta 2: 0.6 g/dL — ABNORMAL HIGH (ref 0.2–0.5)
Beta Globulin: 0.4 g/dL (ref 0.4–0.6)
Gamma Globulin: 1.7 g/dL (ref 0.8–1.7)
Total Protein: 7.6 g/dL (ref 6.1–8.1)

## 2019-09-07 LAB — HEPATITIS A ANTIBODY, IGM: Hep A IgM: NONREACTIVE

## 2019-09-07 LAB — HIV ANTIBODY (ROUTINE TESTING W REFLEX): HIV 1&2 Ab, 4th Generation: NONREACTIVE

## 2019-09-07 LAB — IGG, IGA, IGM
IgG (Immunoglobin G), Serum: 1811 mg/dL — ABNORMAL HIGH (ref 600–1540)
IgM, Serum: 190 mg/dL (ref 50–300)
Immunoglobulin A: 439 mg/dL — ABNORMAL HIGH (ref 70–320)

## 2019-09-07 LAB — BASIC METABOLIC PANEL WITH GFR
BUN/Creatinine Ratio: 17 (calc) (ref 6–22)
BUN: 79 mg/dL — ABNORMAL HIGH (ref 7–25)
CO2: 32 mmol/L (ref 20–32)
Calcium: 9.2 mg/dL (ref 8.6–10.4)
Chloride: 99 mmol/L (ref 98–110)
Creat: 4.65 mg/dL — ABNORMAL HIGH (ref 0.50–0.99)
GFR, Est African American: 10 mL/min/{1.73_m2} — ABNORMAL LOW (ref >=60)
GFR, Est Non African American: 9 mL/min/{1.73_m2} — ABNORMAL LOW (ref >=60)
Glucose, Bld: 212 mg/dL — ABNORMAL HIGH (ref 65–99)
Potassium: 5.2 mmol/L (ref 3.5–5.3)
Sodium: 140 mmol/L (ref 135–146)

## 2019-09-07 LAB — QUANTIFERON-TB GOLD PLUS
Mitogen-NIL: 7.3 IU/mL
NIL: 0.01 IU/mL
QuantiFERON-TB Gold Plus: NEGATIVE
TB1-NIL: 0 IU/mL
TB2-NIL: 0 IU/mL

## 2019-09-07 LAB — HEPATITIS B CORE ANTIBODY, IGM: Hep B C IgM: NONREACTIVE

## 2019-09-07 LAB — THIOPURINE METHYLTRANSFERASE (TPMT), RBC: Thiopurine Methyltransferase, RBC: 13 nmol/hr/mL RBC

## 2019-09-07 LAB — HEPATITIS B SURFACE ANTIGEN: Hepatitis B Surface Ag: NONREACTIVE

## 2019-09-15 NOTE — Progress Notes (Deleted)
Office Visit Note  Patient: Julie [name]             Date of Birth: 07-Feb-1951           MRN: [id number]             PCP: Rosita Fire, MD Referring: Rosita Fire, MD Visit Date: 09/29/2019 Occupation: @GUAROCC @  Subjective:  No chief complaint on file.   History of Present Illness: Julie [name] is a 69 y.o. female ***   Activities of Daily Living:  Patient reports morning stiffness for *** {minute/hour:19697}.   Patient {ACTIONS;DENIES/REPORTS:21021675::"Denies"} nocturnal pain.  Difficulty dressing/grooming: {ACTIONS;DENIES/REPORTS:21021675::"Denies"} Difficulty climbing stairs: {ACTIONS;DENIES/REPORTS:21021675::"Denies"} Difficulty getting out of chair: {ACTIONS;DENIES/REPORTS:21021675::"Denies"} Difficulty using hands for taps, buttons, cutlery, and/or writing: {ACTIONS;DENIES/REPORTS:21021675::"Denies"}  No Rheumatology ROS completed.   PMFS History:  Patient Active Problem List   Diagnosis Date Noted   CKD (chronic kidney disease) stage 4, GFR 15-29 ml/min (HCC) 08/26/2019   OSA (obstructive sleep apnea) 08/26/2019   History of hypothyroidism 08/26/2019   History of type 2 diabetes mellitus 08/26/2019   Hypertensive heart disease with chronic systolic congestive heart failure ([place]) 96/29/5284   Chronic systolic CHF (congestive heart failure) ([place]) 08/26/2019   History of breast cancer 08/26/2019   History of gastroesophageal reflux (GERD) 08/26/2019   Secondary hyperparathyroidism ([place]) 08/26/2019   Anemia due to stage 5 chronic kidney disease, not on chronic dialysis ([place]) 08/26/2019   A-V fistula ([place]) 08/26/2019    Past Medical History:  Diagnosis Date   CHF (congestive heart failure) (HCC)    CKD (chronic kidney disease)    Diabetes mellitus without complication ([place])    Hypertension    Hypothyroidism    Sleep apnea     Family History  Problem Relation Age of Onset   Diabetes Mother    Diabetes Father    Diabetes Brother      Diabetes Sister    Diabetes Sister    Thyroid disease Sister    Diabetes Son    Healthy Son    Healthy Son    Healthy Son    Healthy Daughter    Past Surgical History:  Procedure Laterality Date   BREAST SURGERY Bilateral    mastectomy    CHOLECYSTECTOMY     HYSTEROSCOPY WITH D & C     x2   VEIN SURGERY Left    vein transferred    Social History   Social History Narrative   Not on file    There is no immunization history on file for this patient.   Objective: Vital Signs: There were no vitals taken for this visit.   Physical Exam   Musculoskeletal Exam: ***  CDAI Exam: CDAI Score: -- Patient Global: --; Provider Global: -- Swollen: --; Tender: -- Joint Exam 09/29/2019   No joint exam has been documented for this visit   There is currently no information documented on the homunculus. Go to the Rheumatology activity and complete the homunculus joint exam.  Investigation: No additional findings.  Imaging: DG Chest 2 View  Result Date: 09/01/2019 CLINICAL DATA:  Shortness of breath on exertion EXAM: CHEST - 2 VIEW COMPARISON:  None. FINDINGS: There is mild cardiomegaly. Prominence of the central pulmonary vasculature is seen. No large airspace consolidation or pleural effusion. No acute osseous abnormality. IMPRESSION: Pulmonary vascular congestion and cardiomegaly Electronically Signed   By: Prudencio Pair M.D.   On: 09/01/2019 20:45   XR Foot 2 Views Left  Result Date: 08/26/2019 First MTP, PIP  and DIP narrowing was noted.  No intertarsal or tibiotalar joint space narrowing was noted.  Inferior calcaneal spur was noted.  No erosive changes were noted. Impression: These findings are consistent with osteoarthritis of the foot.  XR Foot 2 Views Right  Result Date: 08/26/2019 First MTP, PIP and DIP narrowing was noted.  No intertarsal or tibiotalar joint space narrowing was noted.  Inferior calcaneal spur was noted.  No erosive changes were noted.  Impression: These findings are consistent with osteoarthritis of the foot.  XR KNEE 3 VIEW LEFT  Result Date: 08/26/2019 Severe lateral compartment narrowing with lateral and intercondylar osteophytes was noted.  No chondrocalcinosis was noted.  Severe patellofemoral narrowing was noted. Impression: These findings are consistent with severe osteoarthritis and severe chondromalacia patella.  XR KNEE 3 VIEW RIGHT  Result Date: 08/26/2019 Severe lateral compartment narrowing with lateral and intercondylar osteophytes was noted.  No chondrocalcinosis was noted.  Severe patellofemoral narrowing was noted. Impression: These findings are consistent with severe osteoarthritis and severe chondromalacia patella.   Recent Labs: Lab Results  Component Value Date   NA 140 08/31/2019   K 5.2 08/31/2019   CL 99 08/31/2019   CO2 32 08/31/2019   GLUCOSE 212 (H) 08/31/2019   BUN 79 (H) 08/31/2019   CREATININE 4.65 (H) 08/31/2019   PROT 7.6 08/31/2019   CALCIUM 9.2 08/31/2019   GFRAA 10 (L) 08/31/2019   QFTBGOLDPLUS NEGATIVE 08/31/2019    Speciality Comments: No specialty comments available.  Procedures:  No procedures performed Allergies: Ace inhibitors and Lortab [hydrocodone-acetaminophen]   Assessment / Plan:     Visit Diagnoses: No diagnosis found.  Orders: No orders of the defined types were placed in this encounter.  No orders of the defined types were placed in this encounter.   Face-to-face time spent with patient was *** minutes. Greater than 50% of time was spent in counseling and coordination of care.  Follow-Up Instructions: No follow-ups on file.   Earnestine Mealing, CMA  Note - This record has been created using Editor, commissioning.  Chart creation errors have been sought, but may not always  have been located. Such creation errors do not reflect on  the standard of medical care.

## 2019-09-22 ENCOUNTER — Ambulatory Visit: Payer: Medicare Other | Admitting: Rheumatology

## 2019-09-26 ENCOUNTER — Ambulatory Visit: Payer: Medicare Other | Admitting: Rheumatology

## 2019-09-27 NOTE — Progress Notes (Signed)
Office Visit Note  Patient: Julie [name]             Date of Birth: Jun 06, 1950           MRN: [id number]             PCP: Rosita Fire, MD Referring: Rosita Fire, MD Visit Date: 09/28/2019 Occupation: _0 @  Subjective:  Discuss medication options   History of Present Illness: ROSALINE [name] is a 69 y.o. female with history of seropositive rheumatoid arthritis and osteoarthritis.  She reports her joint pain and inflammation improved while taking the prednisone taper prescribed on 08/31/19.  She continues to have intermittent pain in multiple joints if she is not taking prednisone. She has ongoing joint stiffness.  She would like to discuss treatment options to help her discomfort and stiffness so she does not have to continue taking prednisone. She states she took 2 tablets of prednisone yesterday.  She denies any joint pain or inflammation today.  She continues to use a cane to assist with ambulation.    Activities of Daily Living:  Patient reports morning stiffness for  5-10 minutes.   Patient Reports nocturnal pain.  Difficulty dressing/grooming: Denies Difficulty climbing stairs: Reports Difficulty getting out of chair: Reports Difficulty using hands for taps, buttons, cutlery, and/or writing: Denies  Review of Systems  Constitutional: Positive for fatigue.  HENT: Negative for mouth sores, mouth dryness and nose dryness.   Eyes: Positive for pain, itching and dryness. Negative for photophobia, redness and visual disturbance.  Respiratory: Positive for shortness of breath. Negative for cough, hemoptysis and difficulty breathing.   Cardiovascular: Negative for chest pain, palpitations, hypertension and swelling in legs/feet.  Gastrointestinal: Negative for blood in stool, constipation and diarrhea.  Endocrine: Negative for increased urination.  Genitourinary: Negative for difficulty urinating and painful urination.  Musculoskeletal: Positive for arthralgias, joint pain,  joint swelling and morning stiffness. Negative for myalgias, muscle weakness, muscle tenderness and myalgias.  Skin: Negative for color change, pallor, rash, hair loss, nodules/bumps, redness, skin tightness, ulcers and sensitivity to sunlight.  Allergic/Immunologic: Negative for susceptible to infections.  Neurological: Positive for dizziness. Negative for numbness, headaches, memory loss and weakness.  Hematological: Negative for bruising/bleeding tendency and swollen glands.  Psychiatric/Behavioral: Negative for depressed mood, confusion and sleep disturbance. The patient is not nervous/anxious.     PMFS History:  Patient Active Problem List   Diagnosis Date Noted  . CKD (chronic kidney disease) stage 4, GFR 15-29 ml/min (HCC) 08/26/2019  . OSA (obstructive sleep apnea) 08/26/2019  . History of hypothyroidism 08/26/2019  . History of type 2 diabetes mellitus 08/26/2019  . Hypertensive heart disease with chronic systolic congestive heart failure ([place]) 08/26/2019  . Chronic systolic CHF (congestive heart failure) ([place]) 08/26/2019  . History of breast cancer 08/26/2019  . History of gastroesophageal reflux (GERD) 08/26/2019  . Secondary hyperparathyroidism ([place]) 08/26/2019  . Anemia due to stage 5 chronic kidney disease, not on chronic dialysis ([place]) 08/26/2019  . A-V fistula ([place]) 08/26/2019    Past Medical History:  Diagnosis Date  . CHF (congestive heart failure) ([place])   . CKD (chronic kidney disease)   . Diabetes mellitus without complication ([place])   . Hypertension   . Hypothyroidism   . Sleep apnea     Family History  Problem Relation Age of Onset  . Diabetes Mother   . Diabetes Father   . Diabetes Brother   . Diabetes Sister   . Diabetes Sister   . Thyroid  disease Sister   . Diabetes Son   . Healthy Son   . Healthy Son   . Healthy Son   . Healthy Daughter    Past Surgical History:  Procedure Laterality Date  . BREAST SURGERY Bilateral    mastectomy   .  CHOLECYSTECTOMY    . HYSTEROSCOPY WITH D & C     x2  . VEIN SURGERY Left    vein transferred    Social History   Social History Narrative  . Not on file    There is no immunization history on file for this patient.   Objective: Vital Signs: BP 134/84 (BP Location: Right Arm, Patient Position: Sitting, Cuff Size: Large)   Pulse 96   Resp 18   Ht 5' 8" (1.727 m)   Wt (!) 369 lb (167.4 kg)   BMI 56.11 kg/m    Physical Exam Vitals and nursing note reviewed.  Constitutional:      Appearance: She is well-developed.  HENT:     Head: Normocephalic and atraumatic.  Eyes:     Conjunctiva/sclera: Conjunctivae normal.  Pulmonary:     Effort: Pulmonary effort is normal.  Abdominal:     General: Bowel sounds are normal.     Palpations: Abdomen is soft.  Musculoskeletal:     Cervical back: Normal range of motion.  Lymphadenopathy:     Cervical: No cervical adenopathy.  Skin:    General: Skin is warm and dry.     Capillary Refill: Capillary refill takes less than 2 seconds.  Neurological:     Mental Status: She is alert and oriented to person, place, and time.  Psychiatric:        Behavior: Behavior normal.      Musculoskeletal Exam: C-spine good ROM. Postural thoracic kyphosis. Shoulder joints, elbow joints, wrist joints, MCPs, PIPs, and DIPs good ROM with no synovitis. Complete fist formation bilaterally. Knee joints good ROM with no discomfort.  No warmth or effusion of knee joints. No tenderness of ankle joints.  No tenderness of MTP joints. Pedal edema noted bilaterally.   CDAI Exam: CDAI Score: -- Patient Global: --; Provider Global: -- Swollen: --; Tender: -- Joint Exam 09/28/2019   No joint exam has been documented for this visit   There is currently no information documented on the homunculus. Go to the Rheumatology activity and complete the homunculus joint exam.  Investigation: No additional findings.  Imaging: DG Chest 2 View  Result Date:  09/01/2019 CLINICAL DATA:  Shortness of breath on exertion EXAM: CHEST - 2 VIEW COMPARISON:  None. FINDINGS: There is mild cardiomegaly. Prominence of the central pulmonary vasculature is seen. No large airspace consolidation or pleural effusion. No acute osseous abnormality. IMPRESSION: Pulmonary vascular congestion and cardiomegaly Electronically Signed   By: Prudencio Pair M.D.   On: 09/01/2019 20:45    Recent Labs: Lab Results  Component Value Date   NA 140 08/31/2019   K 5.2 08/31/2019   CL 99 08/31/2019   CO2 32 08/31/2019   GLUCOSE 212 (H) 08/31/2019   BUN 79 (H) 08/31/2019   CREATININE 4.65 (H) 08/31/2019   PROT 7.6 08/31/2019   CALCIUM 9.2 08/31/2019   GFRAA 10 (L) 08/31/2019   QFTBGOLDPLUS NEGATIVE 08/31/2019    Speciality Comments: No specialty comments available.  Procedures:  No procedures performed Allergies: Ace inhibitors and Lortab [hydrocodone-acetaminophen]   Assessment / Plan:     Visit Diagnoses: Rheumatoid arthritis involving multiple sites with positive rheumatoid factor (HCC) -  RF  21, anti-CCP negative, ESR 125 -patient gives history of intermittent swelling in multiple joints for the last few years: X-rays were unremarkable.  She does not have any tenderness or synovitis on examination today.  She was prescribed a prednisone taper on 08/31/2019 after presenting with a flare.  She completed the prednisone taper and noted significant improvement in her joint pain and inflammation.  She continues to take prednisone as needed prescribed by her PCP.  According to the patient while taking prednisone she does not have any joint pain and is apprehensive to start on any immunosuppressive agents at this time.  Her treatment options are limited due to her history of severe kidney disease and congestive heart failure.  The indications, contraindications, potential side effects of Orencia were discussed today.  All questions were addressed.  She would like to review the handout on  her own and think about this decision before starting.  She was advised to notify us if she would like [place] to apply for Orencia 125 mg subcutaneous injections once weekly.  She will follow up as needed.   Medication counseling:  TB Gold: negative on 08/31/19 Hepatitis panel: Negative on 08/31/19 HIV: Negative on 08/31/19 SPEP: No abnormal protein bands are apparent on 08/31/19 Immunoglobulins: IgA and IgG elevated on 08/31/19  Does patient have a diagnosis of COPD? No  Counseled patient that Maureen Chatters is a selective T-cell costimulation blocker indicated for rheumatoid arthritis.  Counseled patient on purpose, proper use, and adverse effects of Orencia. The most common adverse effects are increased risk of infections, headache, and injection site reactions.  There is the possibility of an increased risk of malignancy but it is not well understood if this increased risk is due to the medication or the disease state.  Reviewed the importance of regular labs while on Orencia therapy.  Counseled patient that Maureen Chatters should be held prior to scheduled surgery.  Counseled patient to avoid live vaccines while on Orencia.  Advised patient to get annual influenza vaccine and the pneumococcal vaccine as indicated.  Provided patient with medication education material and answered all questions.  Patient consented to [facility].  Will upload consent into patient's chart.  Will apply for Orencia through patient's insurance.  Reviewed storage information for Orencia.  Advised initial injection must be administered in office.    High risk medication use - Discussed Orencia 125 mg sq injections once weekly.  She is not a good candidate for anti-TNF inhibitors due to her history of congestive heart failure.  She also has severe stage V chronic kidney disease so her treatment options are limited.  Elevated sedimentation rate: ESR was 125 on 08/31/2019.  Dr. Estanislado Pandy discussed the elevated sed rate with her PCP Dr. Legrand Rams.  Dr. Delma Freeze  were suggested a possible work-up with CT abdomen and pelvis and a total body bone scan with Dr. Legrand Rams suggested waiting at this time.  She has had an updated colonoscopy and mammogram.  Primary osteoarthritis of both feet: She is not having any discomfort in her feet at this time.  She has severe pedal edema bilaterally.  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  She has intermittent discomfort in both knee joints.  No warmth or effusion was noted.  She uses a cane to assist with ambulation.  Hyperuricemia - Uric acid 9.8.  She denies any history of gout.  Myalgia - CK was normal. She is not having any myalgias at this time.   Other medical  conditions are listed as follows:   Chronic systolic CHF (congestive heart failure) (HCC)  Hypertensive heart disease with chronic systolic congestive heart failure (HCC)  CKD (chronic kidney disease) stage 4, GFR 15-29 ml/min (HCC)  History of type 2 diabetes mellitus  Secondary hyperparathyroidism ([place])  Anemia due to stage 5 chronic kidney disease, not on chronic dialysis ([place])  A-V fistula (HCC)  History of breast cancer  History of hypothyroidism  History of gastroesophageal reflux (GERD)  OSA (obstructive sleep apnea)  Orders: No orders of the defined types were placed in this encounter.  No orders of the defined types were placed in this encounter.   Face-to-face time spent with patient was 30 minutes. Greater than 50% of time was spent in counseling and coordination of care.  Follow-Up Instructions: Return if symptoms worsen or fail to improve, for Rheumatoid arthritis.   Ofilia Neas, PA-C  I examined and evaluated the patient with Hazel Sams PA.  Patient has been taking prednisone on as needed basis.  She states she has done it for many years and it has worked well for her.  We are detailed discussion regarding different treatment options.  Due to her  complicated medical history she does not have very many choices.  We had detailed discussion regarding Orencia and its side effects.  Patient is not convinced to go on a disease modifying agent or a biologic agent.  There is nothing much to offer from a rheumatologist point of view.  She has been getting prednisone from her PCP and can continue to do so.  I advised her to come back only if she needs more aggressive therapy.  At this time she is not interested in coming for follow-up visits.  The plan of care was discussed as noted above.  Bo Merino, MD  Note - This record has been created using Editor, commissioning.  Chart creation errors have been sought, but may not always  have been located. Such creation errors do not reflect on  the standard of medical care.

## 2019-09-28 ENCOUNTER — Other Ambulatory Visit: Payer: Self-pay

## 2019-09-28 ENCOUNTER — Ambulatory Visit: Payer: Medicare Other | Admitting: Rheumatology

## 2019-09-28 ENCOUNTER — Encounter: Payer: Self-pay | Admitting: Rheumatology

## 2019-09-28 VITALS — BP 134/84 | HR 96 | Resp 18 | Ht 68.0 in | Wt 369.0 lb

## 2019-09-28 DIAGNOSIS — E790 Hyperuricemia without signs of inflammatory arthritis and tophaceous disease: Secondary | ICD-10-CM

## 2019-09-28 DIAGNOSIS — R700 Elevated erythrocyte sedimentation rate: Secondary | ICD-10-CM | POA: Diagnosis not present

## 2019-09-28 DIAGNOSIS — M19072 Primary osteoarthritis, left ankle and foot: Secondary | ICD-10-CM

## 2019-09-28 DIAGNOSIS — Z8719 Personal history of other diseases of the digestive system: Secondary | ICD-10-CM

## 2019-09-28 DIAGNOSIS — D631 Anemia in chronic kidney disease: Secondary | ICD-10-CM

## 2019-09-28 DIAGNOSIS — M791 Myalgia, unspecified site: Secondary | ICD-10-CM

## 2019-09-28 DIAGNOSIS — N185 Chronic kidney disease, stage 5: Secondary | ICD-10-CM

## 2019-09-28 DIAGNOSIS — G4733 Obstructive sleep apnea (adult) (pediatric): Secondary | ICD-10-CM

## 2019-09-28 DIAGNOSIS — Z79899 Other long term (current) drug therapy: Secondary | ICD-10-CM

## 2019-09-28 DIAGNOSIS — I770 Arteriovenous fistula, acquired: Secondary | ICD-10-CM

## 2019-09-28 DIAGNOSIS — N2581 Secondary hyperparathyroidism of renal origin: Secondary | ICD-10-CM

## 2019-09-28 DIAGNOSIS — I5022 Chronic systolic (congestive) heart failure: Secondary | ICD-10-CM

## 2019-09-28 DIAGNOSIS — M0579 Rheumatoid arthritis with rheumatoid factor of multiple sites without organ or systems involvement: Secondary | ICD-10-CM | POA: Diagnosis not present

## 2019-09-28 DIAGNOSIS — M170 Bilateral primary osteoarthritis of knee: Secondary | ICD-10-CM

## 2019-09-28 DIAGNOSIS — Z853 Personal history of malignant neoplasm of breast: Secondary | ICD-10-CM

## 2019-09-28 DIAGNOSIS — M19071 Primary osteoarthritis, right ankle and foot: Secondary | ICD-10-CM

## 2019-09-28 DIAGNOSIS — N184 Chronic kidney disease, stage 4 (severe): Secondary | ICD-10-CM

## 2019-09-28 DIAGNOSIS — Z8639 Personal history of other endocrine, nutritional and metabolic disease: Secondary | ICD-10-CM

## 2019-09-28 DIAGNOSIS — I110 Hypertensive heart disease with heart failure: Secondary | ICD-10-CM

## 2019-09-28 NOTE — Progress Notes (Signed)
Pharmacy Note  Subjective: Patient presents today to [facility] for follow up office visit.   Patient seen by the pharmacist for counseling on subcutaneous Orencia rheumatoid arthritis.  Patient has CHF making her a poor candidate for anti-TNF therapy.  Prior therapy includes: prednisone only.  Objective: CBC No results found for: WBC, RBC, HGB, HCT, PLT, MCV, MCH, MCHC, RDW, LYMPHSABS, MONOABS, EOSABS, BASOSABS  CMP     Component Value Date/Time   NA 140 08/31/2019 1445   K 5.2 08/31/2019 1445   CL 99 08/31/2019 1445   CO2 32 08/31/2019 1445   GLUCOSE 212 (H) 08/31/2019 1445   BUN 79 (H) 08/31/2019 1445   CREATININE 4.65 (H) 08/31/2019 1445   CALCIUM 9.2 08/31/2019 1445   PROT 7.6 08/31/2019 1445   GFRNONAA 9 (L) 08/31/2019 1445   GFRAA 10 (L) 08/31/2019 1445    Baseline Immunosuppressant Therapy Labs TB GOLD Quantiferon TB Gold Latest Ref Rng & Units 08/31/2019  Quantiferon TB Gold Plus NEGATIVE NEGATIVE   Hepatitis Panel Hepatitis Latest Ref Rng & Units 08/31/2019  Hep B Surface Ag NON-REACTI NON-REACTIVE  Hep B IgM NON-REACTI NON-REACTIVE  Hep A IgM NON-REACTI NON-REACTIVE   HIV Lab Results  Component Value Date   HIV NON-REACTIVE 08/31/2019   Immunoglobulins Immunoglobulin Electrophoresis Latest Ref Rng & Units 08/31/2019  IgA  70 - 320 mg/dL 439(H)  IgG 600 - 1,540 mg/dL 1,811(H)  IgM 50 - 300 mg/dL 190   SPEP Serum Protein Electrophoresis Latest Ref Rng & Units 08/31/2019  Total Protein 6.1 - 8.1 g/dL 7.6  Albumin 3.8 - 4.8 g/dL 3.5(L)  Alpha-1 0.2 - 0.3 g/dL 0.3  Alpha-2 0.5 - 0.9 g/dL 1.1(H)  Beta Globulin 0.4 - 0.6 g/dL 0.4  Beta 2 0.2 - 0.5 g/dL 0.6(H)  Gamma Globulin 0.8 - 1.7 g/dL 1.7   G6PD No results found for: G6PDH TPMT Lab Results  Component Value Date   TPMT 13 08/31/2019     Chest x-ray: Pulmonary vascular congestion and cardiomegaly 09/01/2019  Does patient have a diagnosis of COPD? No  Does patient have history of  diverticulitis?  No  Assessment/Plan:  Counseled patient that Maureen Chatters is a selective T-cell costimulation blocker.  Counseled patient on purpose, proper use, and adverse effects of Orencia. The most common adverse effects are increased risk of infections, headache, and injection site reactions.  There is the possibility of an increased risk of malignancy but it is not well understood if this increased risk is due to the medication or the disease state. Reviewed risk of GI perforation which is higher in patients with diverticulitis and diabetes.  Counseled patient that Maureen Chatters should be held prior to scheduled surgery.  Counseled patient to avoid live vaccines while on Orencia.  Recommend annual influenza, Pneumovax 23, Prevnar 13, and Shingrix as indicated.   Reviewed the importance of regular labs while on Orencia therapy. Patient will be due for labs 1 month after starting therapy. Standing orders placed. Provided patient with medication education material and answered all questions.  Patient dose will be Orencia 125 mg every 7 days.    Patient declined therapy at this time.  She was given educational handouts and will review and call the office if she decides to start therapy.  Advised that she may need to apply for patient assistance or research receiving infusions through the medical benefit.  Patient verbalized understanding.  All questions encouraged and answered.  Instructed patient to call with any further questions or concerns.  Mariella Saa, PharmD, [place], CPP Clinical Specialty Pharmacist (Rheumatology and Pulmonology)  09/28/2019 10:59 AM

## 2019-09-28 NOTE — Patient Instructions (Signed)
Abatacept solution for injection (subcutaneous or intravenous use) What is this medicine? ABATACEPT (a ba TA sept) is used to treat moderate to severe active rheumatoid arthritis or psoriatic arthritis in adults. This medicine is also used to treat juvenile idiopathic arthritis. This medicine may be used for other purposes; ask your health care provider or pharmacist if you have questions. COMMON BRAND NAME(S): Orencia What should I tell my health care provider before I take this medicine? They need to know if you have any of these conditions:  cancer  diabetes  hepatitis B or history of hepatitis B infection  immune system problems  infection or history of infection (especially a virus infection such as chickenpox, cold sores, or herpes)  lung or breathing problems, like chronic obstructive pulmonary disease (COPD)  recently received or scheduled to receive a vaccination  scheduled to have surgery  tuberculosis, a positive skin test for tuberculosis, or have recently been in close contact with someone who has tuberculosis  an unusual or allergic reaction to abatacept, other medicines, foods, dyes, or preservatives  pregnant or trying to get pregnant  breast-feeding How should I use this medicine? This medicine is for infusion into a vein or for injection under the skin. Infusions are given by a health care professional in a hospital or clinic setting. If you are to give your own medicine at home, you will be taught how to prepare and give this medicine under the skin. Use exactly as directed. Take your medicine at regular intervals. Do not take your medicine more often than directed. It is important that you put your used needles and syringes in a special sharps container. Do not put them in a trash can. If you do not have a sharps container, call your pharmacist or health care provider to get one. Talk to your pediatrician regarding the use of this medicine in children. While  infusions in a clinic may be prescribed for children as young as 2 years for selected conditions, precautions do apply. Overdosage: If you think you have taken too much of this medicine contact a poison control center or emergency room at once. NOTE: This medicine is only for you. Do not share this medicine with others. What if I miss a dose? This medicine is used once a week if given by injection under the skin. If you miss a dose, take it as soon as you can. If it is almost time for your next dose, take only that dose. Do not take double or extra doses. If you are to be given an infusion of this medicine, it is important not to miss your dose. Doses are usually every 4 weeks. Call your doctor or health care professional if you are unable to keep an appointment. What may interact with this medicine? Do not take this medicine with any of the following medications:  live vaccines This medicine may also interact with the following medications:  anakinra  baricitinib  canakinumab  medicines that lower your chance of fighting an infection  rituximab  TNF blockers such as adalimumab, certolizumab, etanercept, golimumab, infliximab  tocilizumab  tofacitinib  upadacitinib  ustekinumab This list may not describe all possible interactions. Give your health care provider a list of all the medicines, herbs, non-prescription drugs, or dietary supplements you use. Also tell them if you smoke, drink alcohol, or use illegal drugs. Some items may interact with your medicine. What should I watch for while using this medicine? Visit your doctor for regular checks on your   progress. Tell your doctor or health care professional if your symptoms do not start to get better or if they get worse. You will be tested for tuberculosis (TB) before you start this medicine. If your doctor prescribed any medicine for TB, you should start taking the TB medicine before starting this medicine. Make sure to finish the  full course of TB medicine. This medicine may increase your risk of getting an infection. Call your doctor or health care professional if you get fever, chills, or sore throat, or other symptoms of a cold or flu. Do not treat yourself. Try to avoid being around people who are sick. If you have diabetes and are getting this medicine in a vein, the infusion can give false high blood sugar readings on the day of your dose. This may happen if you use certain types of blood glucose tests. Your health care provider may tell you to use a different way to monitor your blood sugar levels. What side effects may I notice from receiving this medicine? Side effects that you should report to your doctor or health care professional as soon as possible:  allergic reactions like skin rash, itching or hives, swelling of the face, lips, or tongue  breathing problems  chest pain  dizziness  signs and symptoms of infection like fever; chills; cough; sore throat; pain or trouble passing urine  unusually weak or tired Side effects that usually do not require medical attention (report to your doctor or health care professional if they continue or are bothersome):  diarrhea  headache  nausea  pain, redness, or irritation at site where injected  stomach pain or upset This list may not describe all possible side effects. Call your doctor for medical advice about side effects. You may report side effects to FDA at 1-800-FDA-1088. Where should I keep my medicine? Infusions will be given in a hospital or clinic and will not be stored at home. Storage for syringes and autoinjectors stored at home: Keep out of the reach of children. Store in a refrigerator between 2 and 8 degrees C (36 and 46 degrees F). Keep this medicine in the original container. Protect from light. Do not freeze. Do not shake. Throw away any unused medicine after the expiration date. NOTE: This sheet is a summary. It may not cover all possible  information. If you have questions about this medicine, talk to your doctor, pharmacist, or health care provider.  2020 Elsevier/Gold Standard (2018-10-12 14:01:21)  

## 2019-09-29 ENCOUNTER — Ambulatory Visit: Payer: Medicare Other | Admitting: Rheumatology

## 2019-12-06 ENCOUNTER — Telehealth: Payer: Self-pay | Admitting: Rheumatology

## 2019-12-06 ENCOUNTER — Telehealth: Payer: Self-pay | Admitting: Orthopaedic Surgery

## 2019-12-06 DIAGNOSIS — M0579 Rheumatoid arthritis with rheumatoid factor of multiple sites without organ or systems involvement: Secondary | ICD-10-CM

## 2019-12-06 NOTE — Telephone Encounter (Signed)
Patient called to relay that her bursitis is "acting up" - said mainly left knee/upper leg/hip - said has already been prescribed Prednisone by another provider; also sees rheumatologist.  Patient asking for something to be prescribed until she can be seen. Relayed Dr Luna Glasgow will need to see her since last seen 07/12/19, before he can prescribe medication.  Opted to call back to her primary care an/or rheumatologist.  Will let us know if she needs an appointment at a later date.

## 2019-12-06 NOTE — Telephone Encounter (Signed)
Julie [name] called and scheduled a follow-up appointment on 01/03/20.  Julie [name] states she is having bilateral leg pain and is having difficulty walking.  Julie [name] is requesting a return call to let her know if Dr. Estanislado Pandy can prescribe something for the pain.

## 2019-12-06 NOTE — Telephone Encounter (Signed)
Patient states she is having pain in the back of her thighs. Patient states she it started on 12/01/2019. Patient states she is having trouble sitting down and trouble walking. Patient states at her last appointment Orencia was discussed but she has not started it. Patient states she was given prednisone 5 mg take 2 daily from her PCP. Please advise.

## 2019-12-06 NOTE — Telephone Encounter (Signed)
She was not satisfied with the suggestions of immunosuppressive therapy.  I would recommend referral to another rheumatologist or [facility].  Please discuss with the patient.

## 2019-12-06 NOTE — Telephone Encounter (Signed)
Attempted to contact to contact the patient and left message for patient to call the office.

## 2019-12-07 NOTE — Telephone Encounter (Signed)
She was not satisfied with the suggestions of immunosuppressive therapy.  Patient advised Dr. Estanislado Pandy  would recommend referral to another rheumatologist or [facility].  Patient states she is in a lot of pain and can hardly walk. Patient advised if she is in that much pain she would need an evaluation at the emergency room. Patient accepted referral to [facility].

## 2019-12-07 NOTE — Telephone Encounter (Signed)
Attempted to contact the patient and left message for patient to call the office.  

## 2019-12-07 NOTE — Addendum Note (Signed)
Addended by: Carole Binning on: 12/07/2019 10:56 AM   Modules accepted: Orders

## 2019-12-20 NOTE — Progress Notes (Deleted)
Office Visit Note  Patient: Julie [name]             Date of Birth: February 16, 1951           MRN: [id number]             PCP: Rosita Fire, MD Referring: Rosita Fire, MD Visit Date: 01/03/2020 Occupation: @GUAROCC @  Subjective:  No chief complaint on file.   History of Present Illness: Julie [name] is a 69 y.o. female ***   Activities of Daily Living:  Patient reports morning stiffness for *** {minute/hour:19697}.   Patient {ACTIONS;DENIES/REPORTS:21021675::"Denies"} nocturnal pain.  Difficulty dressing/grooming: {ACTIONS;DENIES/REPORTS:21021675::"Denies"} Difficulty climbing stairs: {ACTIONS;DENIES/REPORTS:21021675::"Denies"} Difficulty getting out of chair: {ACTIONS;DENIES/REPORTS:21021675::"Denies"} Difficulty using hands for taps, buttons, cutlery, and/or writing: {ACTIONS;DENIES/REPORTS:21021675::"Denies"}  No Rheumatology ROS completed.   PMFS History:  Patient Active Problem List   Diagnosis Date Noted  . CKD (chronic kidney disease) stage 4, GFR 15-29 ml/min (HCC) 08/26/2019  . OSA (obstructive sleep apnea) 08/26/2019  . History of hypothyroidism 08/26/2019  . History of type 2 diabetes mellitus 08/26/2019  . Hypertensive heart disease with chronic systolic congestive heart failure ([place]) 08/26/2019  . Chronic systolic CHF (congestive heart failure) ([place]) 08/26/2019  . History of breast cancer 08/26/2019  . History of gastroesophageal reflux (GERD) 08/26/2019  . Secondary hyperparathyroidism ([place]) 08/26/2019  . Anemia due to stage 5 chronic kidney disease, not on chronic dialysis ([place]) 08/26/2019  . A-V fistula ([place]) 08/26/2019    Past Medical History:  Diagnosis Date  . CHF (congestive heart failure) ([place])   . CKD (chronic kidney disease)   . Diabetes mellitus without complication ([place])   . Hypertension   . Hypothyroidism   . Sleep apnea     Family History  Problem Relation Age of Onset  . Diabetes Mother   . Diabetes Father   . Diabetes Brother     . Diabetes Sister   . Diabetes Sister   . Thyroid disease Sister   . Diabetes Son   . Healthy Son   . Healthy Son   . Healthy Son   . Healthy Daughter    Past Surgical History:  Procedure Laterality Date  . BREAST SURGERY Bilateral    mastectomy   . CHOLECYSTECTOMY    . HYSTEROSCOPY WITH D & C     x2  . VEIN SURGERY Left    vein transferred    Social History   Social History Narrative  . Not on file    There is no immunization history on file for this patient.   Objective: Vital Signs: There were no vitals taken for this visit.   Physical Exam   Musculoskeletal Exam: ***  CDAI Exam: CDAI Score: -- Patient Global: --; Provider Global: -- Swollen: --; Tender: -- Joint Exam 01/03/2020   No joint exam has been documented for this visit   There is currently no information documented on the homunculus. Go to the Rheumatology activity and complete the homunculus joint exam.  Investigation: No additional findings.  Imaging: No results found.  Recent Labs: Lab Results  Component Value Date   NA 140 08/31/2019   K 5.2 08/31/2019   CL 99 08/31/2019   CO2 32 08/31/2019   GLUCOSE 212 (H) 08/31/2019   BUN 79 (H) 08/31/2019   CREATININE 4.65 (H) 08/31/2019   PROT 7.6 08/31/2019   CALCIUM 9.2 08/31/2019   GFRAA 10 (L) 08/31/2019   QFTBGOLDPLUS NEGATIVE 08/31/2019    Speciality Comments: No specialty comments available.  Procedures:  No procedures performed Allergies: Ace inhibitors and Lortab [hydrocodone-acetaminophen]   Assessment / Plan:     Visit Diagnoses: Rheumatoid arthritis involving multiple sites with positive rheumatoid factor (HCC)  High risk medication use  Elevated sedimentation rate  Primary osteoarthritis of both feet  Primary osteoarthritis of both knees  Hyperuricemia  Myalgia  Chronic systolic CHF (congestive heart failure) (HCC)  Hypertensive heart disease with chronic systolic congestive heart failure (HCC)  CKD  (chronic kidney disease) stage 4, GFR 15-29 ml/min (HCC)  History of type 2 diabetes mellitus  Anemia due to stage 5 chronic kidney disease, not on chronic dialysis ([place])  Secondary hyperparathyroidism ([place])  A-V fistula (HCC)  History of breast cancer  History of hypothyroidism  History of gastroesophageal reflux (GERD)  OSA (obstructive sleep apnea)  Orders: No orders of the defined types were placed in this encounter.  No orders of the defined types were placed in this encounter.   Face-to-face time spent with patient was *** minutes. Greater than 50% of time was spent in counseling and coordination of care.  Follow-Up Instructions: No follow-ups on file.   Ofilia Neas, PA-C  Note - This record has been created using Dragon software.  Chart creation errors have been sought, but may not always  have been located. Such creation errors do not reflect on  the standard of medical care.

## 2019-12-27 ENCOUNTER — Ambulatory Visit: Payer: Medicare Other | Admitting: Orthopaedic Surgery

## 2020-01-03 ENCOUNTER — Ambulatory Visit: Payer: Medicare Other | Admitting: Physician Assistant

## 2020-01-10 ENCOUNTER — Ambulatory Visit: Payer: Medicare Other | Admitting: Orthopaedic Surgery

## 2020-06-27 ENCOUNTER — Other Ambulatory Visit (HOSPITAL_BASED_OUTPATIENT_CLINIC_OR_DEPARTMENT_OTHER): Payer: Self-pay

## 2020-06-27 DIAGNOSIS — G4733 Obstructive sleep apnea (adult) (pediatric): Secondary | ICD-10-CM

## 2020-07-17 ENCOUNTER — Encounter: Payer: Medicare Other | Admitting: Neurology

## 2020-08-01 ENCOUNTER — Other Ambulatory Visit: Payer: Self-pay

## 2020-08-01 ENCOUNTER — Ambulatory Visit (HOSPITAL_BASED_OUTPATIENT_CLINIC_OR_DEPARTMENT_OTHER): Payer: Medicare Other | Attending: Neurology | Admitting: Neurology

## 2020-08-01 DIAGNOSIS — G4733 Obstructive sleep apnea (adult) (pediatric): Secondary | ICD-10-CM

## 2020-08-08 ENCOUNTER — Emergency Department (HOSPITAL_COMMUNITY): Payer: Medicare Other

## 2020-08-08 ENCOUNTER — Inpatient Hospital Stay (HOSPITAL_COMMUNITY)
Admission: EM | Admit: 2020-08-08 | Discharge: 2020-08-15 | DRG: 291 | Disposition: A | Discharge status: Home Health | Payer: Medicare Other | Attending: Internal Medicine | Admitting: Internal Medicine

## 2020-08-08 ENCOUNTER — Encounter (HOSPITAL_COMMUNITY): Payer: Self-pay

## 2020-08-08 ENCOUNTER — Other Ambulatory Visit: Payer: Self-pay

## 2020-08-08 DIAGNOSIS — Z9049 Acquired absence of other specified parts of digestive tract: Secondary | ICD-10-CM | POA: Diagnosis not present

## 2020-08-08 DIAGNOSIS — Z20822 Contact with and (suspected) exposure to covid-19: Secondary | ICD-10-CM | POA: Diagnosis present

## 2020-08-08 DIAGNOSIS — R0602 Shortness of breath: Secondary | ICD-10-CM

## 2020-08-08 DIAGNOSIS — I770 Arteriovenous fistula, acquired: Secondary | ICD-10-CM | POA: Diagnosis present

## 2020-08-08 DIAGNOSIS — N19 Unspecified kidney failure: Secondary | ICD-10-CM

## 2020-08-08 DIAGNOSIS — Z79899 Other long term (current) drug therapy: Secondary | ICD-10-CM | POA: Diagnosis not present

## 2020-08-08 DIAGNOSIS — Z8349 Family history of other endocrine, nutritional and metabolic diseases: Secondary | ICD-10-CM

## 2020-08-08 DIAGNOSIS — K7460 Unspecified cirrhosis of liver: Secondary | ICD-10-CM | POA: Diagnosis present

## 2020-08-08 DIAGNOSIS — I132 Hypertensive heart and chronic kidney disease with heart failure and with stage 5 chronic kidney disease, or end stage renal disease: Secondary | ICD-10-CM | POA: Diagnosis not present

## 2020-08-08 DIAGNOSIS — N185 Chronic kidney disease, stage 5: Secondary | ICD-10-CM | POA: Diagnosis not present

## 2020-08-08 DIAGNOSIS — E1122 Type 2 diabetes mellitus with diabetic chronic kidney disease: Secondary | ICD-10-CM | POA: Diagnosis present

## 2020-08-08 DIAGNOSIS — D631 Anemia in chronic kidney disease: Secondary | ICD-10-CM | POA: Diagnosis present

## 2020-08-08 DIAGNOSIS — Z888 Allergy status to other drugs, medicaments and biological substances status: Secondary | ICD-10-CM | POA: Diagnosis not present

## 2020-08-08 DIAGNOSIS — N186 End stage renal disease: Secondary | ICD-10-CM | POA: Diagnosis present

## 2020-08-08 DIAGNOSIS — I4819 Other persistent atrial fibrillation: Secondary | ICD-10-CM | POA: Diagnosis present

## 2020-08-08 DIAGNOSIS — Z7982 Long term (current) use of aspirin: Secondary | ICD-10-CM

## 2020-08-08 DIAGNOSIS — E039 Hypothyroidism, unspecified: Secondary | ICD-10-CM | POA: Diagnosis present

## 2020-08-08 DIAGNOSIS — Z853 Personal history of malignant neoplasm of breast: Secondary | ICD-10-CM | POA: Diagnosis not present

## 2020-08-08 DIAGNOSIS — I509 Heart failure, unspecified: Secondary | ICD-10-CM

## 2020-08-08 DIAGNOSIS — G8929 Other chronic pain: Secondary | ICD-10-CM | POA: Diagnosis present

## 2020-08-08 DIAGNOSIS — M25552 Pain in left hip: Secondary | ICD-10-CM | POA: Diagnosis present

## 2020-08-08 DIAGNOSIS — Z8639 Personal history of other endocrine, nutritional and metabolic disease: Secondary | ICD-10-CM | POA: Diagnosis not present

## 2020-08-08 DIAGNOSIS — G4733 Obstructive sleep apnea (adult) (pediatric): Secondary | ICD-10-CM | POA: Diagnosis present

## 2020-08-08 DIAGNOSIS — I5021 Acute systolic (congestive) heart failure: Secondary | ICD-10-CM | POA: Diagnosis not present

## 2020-08-08 DIAGNOSIS — I5043 Acute on chronic combined systolic (congestive) and diastolic (congestive) heart failure: Secondary | ICD-10-CM | POA: Diagnosis present

## 2020-08-08 DIAGNOSIS — Z794 Long term (current) use of insulin: Secondary | ICD-10-CM | POA: Diagnosis not present

## 2020-08-08 DIAGNOSIS — Z833 Family history of diabetes mellitus: Secondary | ICD-10-CM

## 2020-08-08 DIAGNOSIS — K219 Gastro-esophageal reflux disease without esophagitis: Secondary | ICD-10-CM | POA: Diagnosis present

## 2020-08-08 DIAGNOSIS — N2581 Secondary hyperparathyroidism of renal origin: Secondary | ICD-10-CM | POA: Diagnosis present

## 2020-08-08 DIAGNOSIS — I5022 Chronic systolic (congestive) heart failure: Secondary | ICD-10-CM | POA: Diagnosis present

## 2020-08-08 DIAGNOSIS — Z992 Dependence on renal dialysis: Secondary | ICD-10-CM

## 2020-08-08 LAB — BASIC METABOLIC PANEL
Anion gap: 13 (ref 5–15)
BUN: 128 mg/dL — ABNORMAL HIGH (ref 8–23)
CO2: 25 mmol/L (ref 22–32)
Calcium: 8 mg/dL — ABNORMAL LOW (ref 8.9–10.3)
Chloride: 98 mmol/L (ref 98–111)
Creatinine, Ser: 4.4 mg/dL — ABNORMAL HIGH (ref 0.44–1.00)
GFR, Estimated: 10 mL/min — ABNORMAL LOW (ref >60)
Glucose, Bld: 131 mg/dL — ABNORMAL HIGH (ref 70–99)
Potassium: 4 mmol/L (ref 3.5–5.1)
Sodium: 136 mmol/L (ref 135–145)

## 2020-08-08 LAB — CBG MONITORING, ED: Glucose-Capillary: 87 mg/dL (ref 70–99)

## 2020-08-08 LAB — CBC
HCT: 32.2 % — ABNORMAL LOW (ref 36.0–46.0)
Hemoglobin: 9.7 g/dL — ABNORMAL LOW (ref 12.0–15.0)
MCH: 28.9 pg (ref 26.0–34.0)
MCHC: 30.1 g/dL (ref 30.0–36.0)
MCV: 95.8 fL (ref 80.0–100.0)
Platelets: 245 10*3/uL (ref 150–400)
RBC: 3.36 MIL/uL — ABNORMAL LOW (ref 3.87–5.11)
RDW: 13.5 % (ref 11.5–15.5)
WBC: 10.6 10*3/uL — ABNORMAL HIGH (ref 4.0–10.5)
nRBC: 0 % (ref 0.0–0.2)

## 2020-08-08 LAB — RESP PANEL BY RT-PCR (FLU A&B, COVID) ARPGX2
Influenza A by PCR: NEGATIVE
Influenza B by PCR: NEGATIVE
SARS Coronavirus 2 by RT PCR: NEGATIVE

## 2020-08-08 LAB — BRAIN NATRIURETIC PEPTIDE: B Natriuretic Peptide: 1127 pg/mL — ABNORMAL HIGH (ref 0.0–100.0)

## 2020-08-08 LAB — GLUCOSE, CAPILLARY: Glucose-Capillary: 109 mg/dL — ABNORMAL HIGH (ref 70–99)

## 2020-08-08 MED ORDER — ACETAMINOPHEN 325 MG PO TABS
650.0000 mg | ORAL_TABLET | Freq: Four times a day (QID) | ORAL | Status: DC | PRN
  Administered 2020-08-09 – 2020-08-13 (×4): 650 mg via ORAL
  Filled 2020-08-08 (×4): qty 2

## 2020-08-08 MED ORDER — FUROSEMIDE 10 MG/ML IJ SOLN: 60.0000 mg | Freq: Two times a day (BID) | INTRAMUSCULAR | Status: DC

## 2020-08-08 MED ORDER — HYDROCODONE-ACETAMINOPHEN 10-325 MG PO TABS
1.0000 | ORAL_TABLET | Freq: Four times a day (QID) | ORAL | Status: DC | PRN
  Administered 2020-08-13 – 2020-08-14 (×4): 1 via ORAL
  Filled 2020-08-08 (×6): qty 1

## 2020-08-08 MED ORDER — INSULIN ASPART 100 UNIT/ML ~~LOC~~ SOLN
0.0000 [IU] | Freq: Three times a day (TID) | SUBCUTANEOUS | Status: DC
  Administered 2020-08-09: 1 [IU] via SUBCUTANEOUS
  Administered 2020-08-09 (×2): 2 [IU] via SUBCUTANEOUS
  Administered 2020-08-10 (×2): 1 [IU] via SUBCUTANEOUS
  Administered 2020-08-11: 3 [IU] via SUBCUTANEOUS

## 2020-08-08 MED ORDER — ALBUTEROL SULFATE (2.5 MG/3ML) 0.083% IN NEBU
INHALATION_SOLUTION | RESPIRATORY_TRACT | Status: AC
  Administered 2020-08-08: 2.5 mg via RESPIRATORY_TRACT
  Filled 2020-08-08: qty 3

## 2020-08-08 MED ORDER — ALBUTEROL SULFATE (2.5 MG/3ML) 0.083% IN NEBU: 2.5000 mg | INHALATION_SOLUTION | Freq: Four times a day (QID) | RESPIRATORY_TRACT | Status: AC

## 2020-08-08 MED ORDER — ONDANSETRON HCL 4 MG PO TABS
4.0000 mg | ORAL_TABLET | Freq: Four times a day (QID) | ORAL | Status: DC | PRN
Start: 2020-08-08 — End: 2020-08-16

## 2020-08-08 MED ORDER — GABAPENTIN 100 MG PO CAPS
100.0000 mg | ORAL_CAPSULE | Freq: Three times a day (TID) | ORAL | Status: DC
  Administered 2020-08-08 – 2020-08-15 (×20): 100 mg via ORAL
  Filled 2020-08-08 (×20): qty 1

## 2020-08-08 MED ORDER — PANTOPRAZOLE SODIUM 40 MG PO TBEC
40.0000 mg | DELAYED_RELEASE_TABLET | Freq: Every day | ORAL | Status: DC
  Administered 2020-08-09 – 2020-08-15 (×7): 40 mg via ORAL
  Filled 2020-08-08 (×7): qty 1

## 2020-08-08 MED ORDER — INSULIN DEGLUDEC 100 UNIT/ML ~~LOC~~ SOPN: 15.0000 [IU] | PEN_INJECTOR | Freq: Every day | SUBCUTANEOUS | Status: DC

## 2020-08-08 MED ORDER — FUROSEMIDE 10 MG/ML IJ SOLN
40.0000 mg | INTRAMUSCULAR | Status: AC
  Administered 2020-08-08: 40 mg via INTRAVENOUS
  Filled 2020-08-08: qty 4

## 2020-08-08 MED ORDER — ACETAMINOPHEN 650 MG RE SUPP: 650.0000 mg | Freq: Four times a day (QID) | RECTAL | Status: DC | PRN

## 2020-08-08 MED ORDER — POLYETHYLENE GLYCOL 3350 17 G PO PACK: 17.0000 g | PACK | Freq: Every day | ORAL | Status: DC | PRN

## 2020-08-08 MED ORDER — FUROSEMIDE 10 MG/ML IJ SOLN
20.0000 mg | Freq: Once | INTRAMUSCULAR | Status: AC
  Administered 2020-08-08: 20 mg via INTRAVENOUS
  Filled 2020-08-08: qty 2

## 2020-08-08 MED ORDER — ONDANSETRON HCL 4 MG/2ML IJ SOLN: 4.0000 mg | Freq: Four times a day (QID) | INTRAMUSCULAR | Status: DC | PRN

## 2020-08-08 MED ORDER — METOPROLOL SUCCINATE ER 50 MG PO TB24
50.0000 mg | ORAL_TABLET | Freq: Two times a day (BID) | ORAL | Status: DC
  Administered 2020-08-08 – 2020-08-14 (×11): 50 mg via ORAL
  Filled 2020-08-08 (×13): qty 1

## 2020-08-08 MED ORDER — INSULIN GLARGINE 100 UNIT/ML ~~LOC~~ SOLN
15.0000 [IU] | Freq: Every day | SUBCUTANEOUS | Status: DC
  Administered 2020-08-08 – 2020-08-14 (×7): 15 [IU] via SUBCUTANEOUS
  Filled 2020-08-08 (×8): qty 0.15

## 2020-08-08 MED ORDER — INSULIN ASPART 100 UNIT/ML ~~LOC~~ SOLN
0.0000 [IU] | Freq: Every day | SUBCUTANEOUS | Status: DC
  Administered 2020-08-09: 3 [IU] via SUBCUTANEOUS
  Administered 2020-08-10: 2 [IU] via SUBCUTANEOUS

## 2020-08-08 MED ORDER — APIXABAN 5 MG PO TABS
5.0000 mg | ORAL_TABLET | Freq: Two times a day (BID) | ORAL | Status: DC
  Administered 2020-08-08 – 2020-08-09 (×2): 5 mg via ORAL
  Filled 2020-08-08 (×2): qty 1

## 2020-08-08 NOTE — Discharge Instructions (Addendum)
Your testing does not show any signs of pneumonia or infection.  Please continue to follow-up with your doctor and your nephrologist, I would like for you to come back to the hospital immediately for worsening shortness of breath.

## 2020-08-08 NOTE — ED Provider Notes (Signed)
This patient is a 70 year old female, she has a high body mass index, she is known to have chronic kidney disease with a creatinine of around 4.5 at baseline, she has congestive heart failure hypertension and hypothyroidism as well as diabetes.  She presents to the hospital from her nephrologist office, evidently the patient is more short of breath, more dyspneic, more tachycardic, she has bilateral lower extremity edema swelling all the way up through her thighs and has developed several days of thigh pain and hip pain.  For me she states it is in her left hip, I cannot reproduce it on palpation however with range of motion of the hip she seems to have pain.  She is able to straight leg raise bilaterally, she has pitting edema all the way up to her thighs.  She is minimally tachypneic, she is tachycardic to 115 and a sinus tachycardia, her BNP is elevated over 1000, the CT scan of the chest does not reveal infiltrate, I suspect that her progressive edema her lack of being able to diurese despite taking her Demadex is causing her leg pain and shortness of breath with orthopnea.  Will discuss with hospitalist for admission for diuresis.  The patient's renal function is no worse than it was, her potassium is normal at 4.0, BUN is 128 which is up from last year when it was 79.  Though this is a chronic uremia this is likely worsening and suggestive of possible need for expedited dialysis course though not necessarily emergent dialysis  I discussed the case with Dr. Denton Brick who has been kind enough to see this patient for admission   Noemi Chapel, MD 08/08/20 [phone]

## 2020-08-08 NOTE — ED Provider Notes (Signed)
[facility] EMERGENCY DEPARTMENT Provider Note   CSN: [id number] Arrival date & time: 08/08/20  1209     History Chief Complaint  Patient presents with  . Shortness of Breath    Julie [name] is a 70 y.o. female.  Patient referred in from the nephrologist office.  Patient has a history of congestive heart failure chronic kidney disease.  Also diabetes stage V just on Friday had AV fistula placed.  So they preparing her for dialysis but has not obviously been started on dialysis.  Patient states that her shortness of breath is baseline.  I think she had multiple complaints in the nephrologist office which led to them referring her here.  But her main complaints are increased swelling to her right lower extremity.  But she has swelling to both.  And then pain in the right hip area.  Patient's oxygen saturation here on room air is 100%.  Appears patient is still on Demadex.  She is still making urine.  She also complains of pain to the bottom of her foot.  Raising question that maybe the left hip pain and the foot pain is more of a sciatic kind of pain.        Past Medical History:  Diagnosis Date  . CHF (congestive heart failure) ([place])   . CKD (chronic kidney disease)   . Diabetes mellitus without complication ([place])   . Hypertension   . Hypothyroidism   . Sleep apnea     Patient Active Problem List   Diagnosis Date Noted  . CKD (chronic kidney disease) stage 4, GFR 15-29 ml/min (HCC) 08/26/2019  . OSA (obstructive sleep apnea) 08/26/2019  . History of hypothyroidism 08/26/2019  . History of type 2 diabetes mellitus 08/26/2019  . Hypertensive heart disease with chronic systolic congestive heart failure ([place]) 08/26/2019  . Chronic systolic CHF (congestive heart failure) ([place]) 08/26/2019  . History of breast cancer 08/26/2019  . History of gastroesophageal reflux (GERD) 08/26/2019  . Secondary hyperparathyroidism ([place]) 08/26/2019  . Anemia due to stage 5 chronic kidney  disease, not on chronic dialysis ([place]) 08/26/2019  . A-V fistula ([place]) 08/26/2019    Past Surgical History:  Procedure Laterality Date  . AV FISTULA PLACEMENT    . BREAST SURGERY Bilateral    mastectomy   . CHOLECYSTECTOMY    . HYSTEROSCOPY WITH D & C     x2  . VEIN SURGERY Left    vein transferred      OB History   No obstetric history on file.     Family History  Problem Relation Age of Onset  . Diabetes Mother   . Diabetes Father   . Diabetes Brother   . Diabetes Sister   . Diabetes Sister   . Thyroid disease Sister   . Diabetes Son   . Healthy Son   . Healthy Son   . Healthy Son   . Healthy Daughter     Social History   Tobacco Use  . Smoking status: Never Smoker  . Smokeless tobacco: Never Used  Vaping Use  . Vaping Use: Never used  Substance Use Topics  . Alcohol use: Not Currently  . Drug use: Never    Home Medications Prior to Admission medications   Medication Sig Start Date End Date Taking? Authorizing Provider  aspirin EC 81 MG tablet Take 81 mg by mouth daily.    [provider]  calcitRIOL (ROCALTROL) 0.25 MCG capsule Take 0.25 mcg by mouth daily.  [provider]  cholecalciferol (VITAMIN D3) 25 MCG (1000 UNIT) tablet Take 1,000 Units by mouth daily.    [provider]  cyclobenzaprine (FLEXERIL) 5 MG tablet Take 5 mg by mouth 3 (three) times daily as needed for muscle spasms.    [provider]  gabapentin (NEURONTIN) 100 MG capsule Take 100 mg by mouth 3 (three) times daily.    [provider]  hydrOXYzine (ATARAX/VISTARIL) 25 MG tablet Take 25 mg by mouth daily as needed.     [provider]  insulin aspart (NOVOLOG) 100 UNIT/ML injection Inject into the skin 3 (three) times daily before meals.    [provider]  insulin degludec (TRESIBA FLEXTOUCH) 100 UNIT/ML FlexTouch Pen Inject 30 Units into the skin. 08/02/19   [provider]  lipase/protease/amylase (CREON)  12000 units CPEP capsule Take 10,440 Units by mouth.    [provider]  metoprolol tartrate (LOPRESSOR) 25 MG tablet Take 25 mg by mouth 2 (two) times daily.    [provider]  OZEMPIC, 0.25 OR 0.5 MG/DOSE, 2 MG/1.5ML SOPN INJECT 0.375MLS (0.'5MG'$  TOTAL) SUBCUTANEOUSLY ONCE A WEEK INCREASE TO 0.'5MG'$  WEEK DOSE 07/19/19   [provider]  pantoprazole (PROTONIX) 40 MG tablet Take 40 mg by mouth daily.    [provider]  predniSONE (DELTASONE) 5 MG tablet Take 4 tabs po qd x 4 days, 3  tabs po qd x 4 days, 2  tabs po qd x 4 days, 1  tab po qd x 4 days Patient not taking: Reported on 09/28/2019 08/31/19   Bo Merino, MD  sodium bicarbonate 650 MG tablet Take 650 mg by mouth daily as needed.     [provider]  torsemide (DEMADEX) 20 MG tablet Take 20 mg by mouth daily.    [provider]    Allergies    Ace inhibitors and Lortab [hydrocodone-acetaminophen]  Review of Systems   Review of Systems  Constitutional: Negative for chills and fever.  HENT: Negative for rhinorrhea and sore throat.   Eyes: Negative for visual disturbance.  Respiratory: Positive for shortness of breath. Negative for cough.   Cardiovascular: Positive for leg swelling. Negative for chest pain.  Gastrointestinal: Negative for abdominal pain, diarrhea, nausea and vomiting.  Genitourinary: Negative for dysuria.  Musculoskeletal: Negative for back pain and neck pain.  Skin: Negative for rash.  Neurological: Negative for dizziness, light-headedness and headaches.  Hematological: Does not bruise/bleed easily.  Psychiatric/Behavioral: Negative for confusion.    Physical Exam Updated Vital Signs BP 103/73   Pulse (!) 115   Temp 98.2 F (36.8 C) (Oral)   Resp 15   Ht 1.727 m ('5\' 8"'$ )   Wt (!) 156.9 kg   SpO2 100%   BMI 52.61 kg/m   Physical Exam Vitals and nursing note reviewed.  Constitutional:      General: She is not in acute distress.    Appearance: She  is well-developed. She is obese.  HENT:     Head: Normocephalic and atraumatic.  Eyes:     Extraocular Movements: Extraocular movements intact.     Conjunctiva/sclera: Conjunctivae normal.     Pupils: Pupils are equal, round, and reactive to light.  Cardiovascular:     Rate and Rhythm: Regular rhythm. Tachycardia present.     Heart sounds: No murmur heard.   Pulmonary:     Effort: Pulmonary effort is normal. No respiratory distress.     Breath sounds: Normal breath sounds.  Abdominal:  Palpations: Abdomen is soft.     Tenderness: There is no abdominal tenderness.  Musculoskeletal:        General: Swelling and tenderness present.     Cervical back: Neck supple.     Right lower leg: Edema present.     Left lower leg: Edema present.     Comments: Bilateral lower extremity swelling with chronic skin changes.  More swelling in the right thigh area.  Pain with movement of the left leg at the hip area.  Cap refill 2-3.  Skin:    General: Skin is warm and dry.     Capillary Refill: Capillary refill takes 2 to 3 seconds.  Neurological:     General: No focal deficit present.     Mental Status: She is alert and oriented to person, place, and time.     Cranial Nerves: No cranial nerve deficit.     Sensory: No sensory deficit.     Motor: No weakness.     ED Results / Procedures / Treatments   Labs (all labs ordered are listed, but only abnormal results are displayed) Labs Reviewed  CBC - Abnormal; Notable for the following components:      Result Value   WBC 10.6 (*)    RBC 3.36 (*)    Hemoglobin 9.7 (*)    HCT 32.2 (*)    All other components within normal limits  BASIC METABOLIC PANEL - Abnormal; Notable for the following components:   Glucose, Bld 131 (*)    BUN 128 (*)    Creatinine, Ser 4.40 (*)    Calcium 8.0 (*)    GFR, Estimated 10 (*)    All other components within normal limits  BRAIN NATRIURETIC PEPTIDE - Abnormal; Notable for the following components:   B  Natriuretic Peptide 1,127.0 (*)    All other components within normal limits    EKG EKG Interpretation  Date/Time:  Wednesday August 08 2020 12:20:51 EDT Ventricular Rate:  113 PR Interval:  128 QRS Duration: 118 QT Interval:  326 QTC Calculation: 447 R Axis:   38 Text Interpretation: Ectopic atrial tachycardia, unifocal Ventricular premature complex Nonspecific intraventricular conduction delay Probable anteroseptal infarct, old ST elevation, consider inferior injury Baseline wander No previous ECGs available ? Atrial Fibrillation Reconfirmed by Fredia Sorrow [phone]) on 08/08/2020 12:38:19 PM   Radiology US Venous Img Lower Bilateral (DVT)  Result Date: 08/08/2020 CLINICAL DATA:  Shortness of breath. EXAM: BILATERAL LOWER EXTREMITY VENOUS DOPPLER ULTRASOUND TECHNIQUE: Gray-scale sonography with graded compression, as well as color Doppler and duplex ultrasound were performed to evaluate the lower extremity deep venous systems from the level of the common femoral vein and including the common femoral, femoral, profunda femoral, popliteal and calf veins including the posterior tibial, peroneal and gastrocnemius veins when visible. The superficial great saphenous vein was also interrogated. Spectral Doppler was utilized to evaluate flow at rest and with distal augmentation maneuvers in the common femoral, femoral and popliteal veins. COMPARISON:  None. FINDINGS: RIGHT LOWER EXTREMITY Common Femoral Vein: No evidence of thrombus. Normal compressibility, respiratory phasicity and response to augmentation. Saphenofemoral Junction: No evidence of thrombus. Normal compressibility and flow on color Doppler imaging. Profunda Femoral Vein: No evidence of thrombus. Normal compressibility and flow on color Doppler imaging. Femoral Vein: No evidence of thrombus, although the distal vein is not visualized. Visualized portions of the vein demonstrate compressibility, respiratory phasicity and response to  augmentation. Popliteal Vein: No evidence of thrombus. Normal compressibility, respiratory phasicity and response to  augmentation. Calf Veins: No evidence of thrombus in the visualized calf veins. The peroneal vein is not visualized. Normal compressibility and flow on color Doppler imaging of the visualized calf veins. Superficial Great Saphenous Vein: No evidence of thrombus. Normal compressibility. Venous Reflux:  None. LEFT LOWER EXTREMITY Common Femoral Vein: No evidence of thrombus. Normal compressibility, respiratory phasicity and response to augmentation. Saphenofemoral Junction: No evidence of thrombus. Normal compressibility and flow on color Doppler imaging. Profunda Femoral Vein: No evidence of thrombus. Normal compressibility and flow on color Doppler imaging. Femoral Vein: No evidence of thrombus, although the distal vein is not visualized. Visualized portions of the vein demonstrate compressibility, respiratory phasicity and response to augmentation. Popliteal Vein: Not visualized. Calf Veins: No evidence of thrombus in the visualized calf veins. The peroneal vein is not visualized. Normal compressibility and flow on color Doppler imaging of the visualized calf veins. Superficial Great Saphenous Vein: No evidence of thrombus. Normal compressibility. Venous Reflux:  None. IMPRESSION: Significantly limited evaluation due to patient body habitus with multiple veins not visualized. Within this limitation, no evidence of DVT. Electronically Signed   By: Margaretha Sheffield MD   On: 08/08/2020 14:23   DG Chest Port 1 View  Result Date: 08/08/2020 CLINICAL DATA:  Shortness of breath. EXAM: PORTABLE CHEST 1 VIEW COMPARISON:  Aug 31, 2019. FINDINGS: Stable cardiomegaly. No pneumothorax is noted. Right lung is clear. Mild left basilar atelectasis or infiltrate is noted with possible associated effusion. Bony thorax is unremarkable. IMPRESSION: Left basilar opacity as described above. Electronically Signed    By: Marijo Conception M.D.   On: 08/08/2020 13:23    Procedures Procedures   Medications Ordered in ED Medications - No data to display  ED Course  I have reviewed the triage vital signs and the nursing notes.  Pertinent labs & imaging results that were available during my care of the patient were reviewed by me and considered in my medical decision making (see chart for details).    MDM Rules/Calculators/A&P                          Patient nontoxic no acute distress here.  Patient does have marked swelling of both lower extremities.  Right lower extremity more in the thigh area with swelling than the left lower extremity.  Chest x-ray raises some concerns about a pleural effusion or may be infiltrate.  Patient with mild leukocytosis.  BNP is elevated at 1127.  BMP significant for potassium being normal at 4.0.  BUN and creatinine seems to be baseline.  Bilateral Doppler studies of the legs limited due to patient's body habitus.  But no evidence of DVT.  X-ray of the left hip and pelvis is pending.  Will probably go ahead and get CT of the chest.  Not concerned necessarily about pulmonary embolus but just to further evaluate the chest x-ray findings.  Which shows left basilar opacity.  Final Clinical Impression(s) / ED Diagnoses Final diagnoses:  Stage 5 chronic kidney disease not on chronic dialysis [facility])    Rx / DC Orders ED Discharge Orders    None       Fredia Sorrow, MD 08/08/20 1439

## 2020-08-08 NOTE — ED Notes (Signed)
Pt reports that son took personal belongings home.  Pt has only cell phone and cane.

## 2020-08-08 NOTE — ED Triage Notes (Signed)
Pt to er room number 7, pt states that she was just at her pmd office and he sent her over for a rapid heart rate, pt states that she was also sent over to have dialysis, states that last week she had a fistula placed in her L arm, but it isn't ready for dialysis yet, pt states that she also has some foot and flank pain, states that it hurts to walk.

## 2020-08-08 NOTE — H&P (Addendum)
.  History and Physical    Julie [name] [id number] DOB: May 17, 1950 DOA: 08/08/2020  PCP: Rosita Fire, MD   Patient coming from: Home  I have personally briefly reviewed patient's old medical records in [place]  Chief Complaint: Leg swelling, difficulty breathing  HPI: Julie [name] is a 70 y.o. female with medical history significant for CKD 5, OSA, diabetes mellitus, hypothyroidism, systolic CHF, hypertension and obstructive sleep apnea. Patient presented to the ED with complaints of leg swelling extending up to her thighs and difficulty breathing.  Patient saw her nephrologist today and was referred to the ED.  Patient reports gradual bilateral leg swelling that is usually worse at night but resolves by morning.  But over the past 3 days, swelling has been persistent and worsening.  She also reports difficulty breathing with minimal activity.  She reports baseline weight of 350lbs, and recently has weight has trended up to 360 (weight checked in ED 346, she does not believe this is accurate).  She denies chest pain, no abdominal pain.  She reports intermittent and chronic nausea and occasional vomiting that is unchanged. She reports compliance with her torsemide, she reports she was told to alternate 50 mg and 100 mg of torsemide every day.  But over the past several days she has been taking 150 mg every day without improvement in her leg swelling.  Patient also reports left hip pain severe enough that she is unable to bear weight.  Pain is on the side of her hip.  She reports a history of bursitis, but this pain is different.  She denies back pain.  She reports she was prescribed gabapentin without significant improvement in pain.  ED Course: Tachycardic to 115, temperature 98.2.  Respiratory rate 12-20.  Blood pressure systolic [id number].  O2 sats 100% on room air. BNP elevated 1127.  Creatinine stable at 4.4.  Chest CT shows cardiomegaly with vascular congestion and liver  cirrhosis. IV Lasix 40 mg x 1 given.  Hospitalist to admit.  Review of Systems: As per HPI all other systems reviewed and negative.  Past Medical History:  Diagnosis Date  . CHF (congestive heart failure) ([place])   . CKD (chronic kidney disease)   . Diabetes mellitus without complication ([place])   . Hypertension   . Hypothyroidism   . Sleep apnea     Past Surgical History:  Procedure Laterality Date  . AV FISTULA PLACEMENT    . BREAST SURGERY Bilateral    mastectomy   . CHOLECYSTECTOMY    . HYSTEROSCOPY WITH D & C     x2  . VEIN SURGERY Left    vein transferred      reports that she has never smoked. She has never used smokeless tobacco. She reports previous alcohol use. She reports that she does not use drugs.  Allergies  Allergen Reactions  . Ace Inhibitors   . Lortab [Hydrocodone-Acetaminophen]     Family History  Problem Relation Age of Onset  . Diabetes Mother   . Diabetes Father   . Diabetes Brother   . Diabetes Sister   . Diabetes Sister   . Thyroid disease Sister   . Diabetes Son   . Healthy Son   . Healthy Son   . Healthy Son   . Healthy Daughter     Prior to Admission medications   Medication Sig Start Date End Date Taking? Authorizing Provider  cholecalciferol (VITAMIN D3) 25 MCG (1000 UNIT) tablet Take 1,000 Units by mouth  daily.   Yes [provider]  ELIQUIS 5 MG TABS tablet Take 1 tablet by mouth 2 (two) times daily. 07/30/20  Yes [provider]  gabapentin (NEURONTIN) 100 MG capsule Take 100 mg by mouth 3 (three) times daily.   Yes [provider]  hydrOXYzine (ATARAX/VISTARIL) 25 MG tablet Take 25 mg by mouth daily as needed.    Yes [provider]  insulin aspart (NOVOLOG) 100 UNIT/ML injection Inject 10 Units into the skin 3 (three) times daily before meals. Sliding scale   Yes [provider]  insulin degludec (TRESIBA FLEXTOUCH) 100 UNIT/ML FlexTouch Pen Inject 22 Units into the skin at bedtime.  08/02/19  Yes [provider]  lipase/protease/amylase (CREON) 12000 units CPEP capsule Take 10,440 Units by mouth 2 (two) times daily.   Yes [provider]  meclizine (ANTIVERT) 12.5 MG tablet Take 12.5 mg by mouth every 6 (six) hours as needed. 06/29/20  Yes [provider]  metoprolol succinate (TOPROL-XL) 50 MG 24 hr tablet Take 50 mg by mouth 2 (two) times daily. 05/14/20  Yes [provider]  OZEMPIC, 0.25 OR 0.5 MG/DOSE, 2 MG/1.5ML SOPN Inject 0.25 mg into the skin 2 (two) times a week. 07/19/19  Yes [provider]  pantoprazole (PROTONIX) 40 MG tablet Take 40 mg by mouth daily.   Yes [provider]  sodium bicarbonate 650 MG tablet Take 650 mg by mouth daily as needed.    Yes [provider]  torsemide (DEMADEX) 100 MG tablet Take 150 mg by mouth daily. 04/18/20  Yes [provider]  aspirin EC 81 MG tablet Take 81 mg by mouth daily. Patient not taking: No sig reported    [provider]  calcitRIOL (ROCALTROL) 0.25 MCG capsule Take 0.25 mcg by mouth daily. Patient not taking: No sig reported    [provider]  cyclobenzaprine (FLEXERIL) 5 MG tablet Take 5 mg by mouth 3 (three) times daily as needed for muscle spasms. Patient not taking: No sig reported    [provider]  metoprolol tartrate (LOPRESSOR) 25 MG tablet Take 25 mg by mouth 2 (two) times daily. Patient not taking: Reported on 08/08/2020    [provider]  predniSONE (DELTASONE) 5 MG tablet Take 4 tabs po qd x 4 days, 3  tabs po qd x 4 days, 2  tabs po qd x 4 days, 1  tab po qd x 4 days Patient not taking: No sig reported 08/31/19   Bo Merino, MD  torsemide (DEMADEX) 20 MG tablet Take 20 mg by mouth daily. Patient not taking: Reported on 08/08/2020    [provider]  traMADol (ULTRAM) 50 MG tablet Take 50 mg by mouth every 6 (six) hours as needed. Patient not taking: Reported on 08/08/2020 08/03/20    [provider]    Physical Exam: Vitals:   08/08/20 1220 08/08/20 1230 08/08/20 1449 08/08/20 1500  BP:  103/73 (!) 135/95 (!) 130/91  Pulse:  (!) 115 (!) 114   Resp:  '15 12 20  '$ Temp:      TempSrc:      SpO2:  100% 100%   Weight: (!) 156.9 kg     Height: '5\' 8"'$  (1.727 m)       Constitutional: NAD, calm, comfortable Vitals:   08/08/20 1220 08/08/20 1230 08/08/20 1449 08/08/20 1500  BP:  103/73 (!) 135/95 (!) 130/91  Pulse:  (!) 115 (!) 114   Resp:  '15 12 20  '$ Temp:  TempSrc:      SpO2:  100% 100%   Weight: (!) 156.9 kg     Height: '5\' 8"'$  (1.727 m)      Eyes: PERRL, lids and conjunctivae normal ENMT: Mucous membranes are moist.  Neck: normal, supple, no masses, no thyromegaly Respiratory: Faint expiratory wheezing heard bilaterally, Normal respiratory effort. No accessory muscle use.  Cardiovascular: Tachycardic, regular rate and rhythm, no murmurs / rubs / gallops.  1+ pitting extremity edema. 2+ pedal pulses.  Abdomen: obese, no tenderness, no masses palpated. No hepatosplenomegaly. Bowel sounds positive.  No asterixis. Musculoskeletal: Able to move bilateral lower extremity, not limited by pain, but tenderness palpated to lateral trunk/upper side of hip joint-due to habitus is difficult to tell exactly. No joint deformity upper and lower extremities. Good ROM, no contractures. Normal muscle tone.  Skin: Skin discoloration bilateral lower extremity due to chronic stasis changes,  no rashes, lesions, ulcers. No induration Neurologic: No apparent cranial nerve abnormalities, moving extremities spontaneously. Psychiatric: Normal judgment and insight. Alert and oriented x 3. Normal mood.   Labs on Admission: I have personally reviewed following labs and imaging studies  CBC: Recent Labs  Lab 08/08/20 1305  WBC 10.6*  HGB 9.7*  HCT 32.2*  MCV 95.8  PLT [id number]   Basic Metabolic Panel: Recent Labs  Lab 08/08/20 1305  NA 136  K 4.0  CL 98  CO2 25  GLUCOSE  131*  BUN 128*  CREATININE 4.40*  CALCIUM 8.0*    Radiological Exams on Admission: CT Chest Wo Contrast  Result Date: 08/08/2020 CLINICAL DATA:  Abnormal x-ray EXAM: CT CHEST WITHOUT CONTRAST TECHNIQUE: Multidetector CT imaging of the chest was performed following the standard protocol without IV contrast. COMPARISON:  Chest x-ray today. FINDINGS: Cardiovascular: Cardiomegaly. Dense mitral valve annular calcifications. Scattered aortic calcifications. No aneurysm. Mediastinum/Nodes: No mediastinal, hilar, or axillary adenopathy. Trachea and esophagus are unremarkable. Lungs/Pleura: No confluent opacities or effusions. Mild vascular congestion. Upper Abdomen: Prior cholecystectomy. Enlarged left hepatic lobe nodular contours suggesting cirrhosis. Musculoskeletal: Chest wall soft tissues are unremarkable. No acute bony abnormality. IMPRESSION: Cardiomegaly, vascular congestion. No confluent airspace opacities or effusions. Appearance of the liver suggests cirrhosis. Recommend clinical correlation. Electronically Signed   By: Rolm Baptise M.D.   On: 08/08/2020 15:46   US Venous Img Lower Bilateral (DVT)  Result Date: 08/08/2020 CLINICAL DATA:  Shortness of breath. EXAM: BILATERAL LOWER EXTREMITY VENOUS DOPPLER ULTRASOUND TECHNIQUE: Gray-scale sonography with graded compression, as well as color Doppler and duplex ultrasound were performed to evaluate the lower extremity deep venous systems from the level of the common femoral vein and including the common femoral, femoral, profunda femoral, popliteal and calf veins including the posterior tibial, peroneal and gastrocnemius veins when visible. The superficial great saphenous vein was also interrogated. Spectral Doppler was utilized to evaluate flow at rest and with distal augmentation maneuvers in the common femoral, femoral and popliteal veins. COMPARISON:  None. FINDINGS: RIGHT LOWER EXTREMITY Common Femoral Vein: No evidence of thrombus. Normal  compressibility, respiratory phasicity and response to augmentation. Saphenofemoral Junction: No evidence of thrombus. Normal compressibility and flow on color Doppler imaging. Profunda Femoral Vein: No evidence of thrombus. Normal compressibility and flow on color Doppler imaging. Femoral Vein: No evidence of thrombus, although the distal vein is not visualized. Visualized portions of the vein demonstrate compressibility, respiratory phasicity and response to augmentation. Popliteal Vein: No evidence of thrombus. Normal compressibility, respiratory phasicity and response to augmentation. Calf Veins: No evidence of thrombus in the visualized  calf veins. The peroneal vein is not visualized. Normal compressibility and flow on color Doppler imaging of the visualized calf veins. Superficial Great Saphenous Vein: No evidence of thrombus. Normal compressibility. Venous Reflux:  None. LEFT LOWER EXTREMITY Common Femoral Vein: No evidence of thrombus. Normal compressibility, respiratory phasicity and response to augmentation. Saphenofemoral Junction: No evidence of thrombus. Normal compressibility and flow on color Doppler imaging. Profunda Femoral Vein: No evidence of thrombus. Normal compressibility and flow on color Doppler imaging. Femoral Vein: No evidence of thrombus, although the distal vein is not visualized. Visualized portions of the vein demonstrate compressibility, respiratory phasicity and response to augmentation. Popliteal Vein: Not visualized. Calf Veins: No evidence of thrombus in the visualized calf veins. The peroneal vein is not visualized. Normal compressibility and flow on color Doppler imaging of the visualized calf veins. Superficial Great Saphenous Vein: No evidence of thrombus. Normal compressibility. Venous Reflux:  None. IMPRESSION: Significantly limited evaluation due to patient body habitus with multiple veins not visualized. Within this limitation, no evidence of DVT. Electronically Signed    By: Margaretha Sheffield MD   On: 08/08/2020 14:23   DG Chest Port 1 View  Result Date: 08/08/2020 CLINICAL DATA:  Shortness of breath. EXAM: PORTABLE CHEST 1 VIEW COMPARISON:  Aug 31, 2019. FINDINGS: Stable cardiomegaly. No pneumothorax is noted. Right lung is clear. Mild left basilar atelectasis or infiltrate is noted with possible associated effusion. Bony thorax is unremarkable. IMPRESSION: Left basilar opacity as described above. Electronically Signed   By: Marijo Conception M.D.   On: 08/08/2020 13:23   DG Hip Unilat W or Wo Pelvis 2-3 Views Left  Result Date: 08/08/2020 CLINICAL DATA:  Left hip pain.  No known injury. EXAM: DG HIP (WITH OR WITHOUT PELVIS) 2-3V LEFT COMPARISON:  None. FINDINGS: There is no evidence of hip fracture or dislocation. There is no evidence of arthropathy or other focal bone abnormality. IMPRESSION: Negative exam. Electronically Signed   By: Inge Rise M.D.   On: 08/08/2020 14:40    EKG: Independently reviewed.  Tachycardic to 113, read as ectopic atrial tachycardia.  No old EKG to compare.  P waves mostly present, rhythm is regular.  Assessment/Plan Principal Problem:   Decompensated heart failure (HCC) Active Problems:   OSA (obstructive sleep apnea)   History of hypothyroidism   History of type 2 diabetes mellitus   Chronic systolic CHF (congestive heart failure) (HCC)   Anemia due to stage 5 chronic kidney disease, not on chronic dialysis (HCC)   A-V fistula (HCC)    Acute on chronic systolic congestive heart failure-at least 1+ pitting edema to knees, swelling involving thighs, chest x-ray showing vascular congestion.  Faint wheezing, likely from congestion. BNP elevated 1127.  No prior to compare.  Reports baseline with weight - 350, weights per chart not accurate.  Last echo per care everywhere 01/2020-EF 45%. -Obtain updated echocardiogram -IV Lasix 60 twice daily -Monitor renal function closely -Please consult nephrology in the morning -BMP  daily, strict input output, daily weights -Resume metoprolol she reports compliance - Albuterol Nebs x 2   CKD 5- 4.4 at baseline, but BUN is elevated at 128.  Mental status is intact, no asterixis.  She has an AV fistula that was revised 4/15-Per care everywhere. -Consult nephrology in the morning -Continue renal medications.  Left hip pain-pelvic x-ray without acute abnormality.  Likely sciatica versus bursitis. Ambulates with a cane. -Hydrocodone acetaminophen 5/325 as needed. - May benefit from PT evaluation prior to d/c  Atrial  fibrillation-  Initiation of anticoagulation was deferred until she had her AV fistula revised, hence she started her Eliquis yesterday 4/19. -Resume metoprolol she reports compliance - Resume eliquis  Diabetes mellitus- random glucose 131. Hgba1c  7.4. -Resume Tresiba at 15 units nightly (home dose 22 units) - SSI- S -Hold Ozempic  Chronic anemia hemoglobin 9.7, at baseline.  Likely due to anemia of chronic disease.  Hypertension -stable. - resum metoprolol - Hold home oral torsemide  Obstructive sleep apnea, morbid obesity - CPAP qhs.   DVT prophylaxis: Eliquis Code Status: Full code Family Communication: None at bedside Disposition Plan: > 2 days Consults called: Nephrology in the morning Admission status: Inpatient, telemetry. I certify that at the point of admission it is my clinical judgment that the patient will require inpatient hospital care spanning beyond 2 midnights from the point of admission due to high intensity of service, high risk for further deterioration and high frequency of surveillance required. The following factors support the patient status of inpatient:    Bethena Roys MD [facility]  08/08/2020, 5:45 PM  \

## 2020-08-09 ENCOUNTER — Inpatient Hospital Stay (HOSPITAL_COMMUNITY): Payer: Medicare Other

## 2020-08-09 DIAGNOSIS — I5021 Acute systolic (congestive) heart failure: Secondary | ICD-10-CM

## 2020-08-09 LAB — ECHOCARDIOGRAM COMPLETE
AR max vel: 1.67 cm2
AV Area VTI: 1.4 cm2
AV Area mean vel: 1.43 cm2
AV Mean grad: 4.5 mmHg
AV Peak grad: 8 mmHg
Ao pk vel: 1.42 m/s
Area-P 1/2: 4.63 cm2
Height: 68 in
MV M vel: 4.87 m/s
MV Peak grad: 94.9 mmHg
S' Lateral: 3.64 cm
Weight: 6024.73 oz

## 2020-08-09 LAB — BASIC METABOLIC PANEL
Anion gap: 13 (ref 5–15)
BUN: 120 mg/dL — ABNORMAL HIGH (ref 8–23)
CO2: 25 mmol/L (ref 22–32)
Calcium: 8.2 mg/dL — ABNORMAL LOW (ref 8.9–10.3)
Chloride: 102 mmol/L (ref 98–111)
Creatinine, Ser: 3.89 mg/dL — ABNORMAL HIGH (ref 0.44–1.00)
GFR, Estimated: 12 mL/min — ABNORMAL LOW (ref >60)
Glucose, Bld: 142 mg/dL — ABNORMAL HIGH (ref 70–99)
Potassium: 4 mmol/L (ref 3.5–5.1)
Sodium: 140 mmol/L (ref 135–145)

## 2020-08-09 LAB — GLUCOSE, CAPILLARY
Glucose-Capillary: 147 mg/dL — ABNORMAL HIGH (ref 70–99)
Glucose-Capillary: 128 mg/dL — ABNORMAL HIGH (ref 70–99)
Glucose-Capillary: 194 mg/dL — ABNORMAL HIGH (ref 70–99)
Glucose-Capillary: 198 mg/dL — ABNORMAL HIGH (ref 70–99)
Glucose-Capillary: 267 mg/dL — ABNORMAL HIGH (ref 70–99)

## 2020-08-09 MED ORDER — METOPROLOL TARTRATE 5 MG/5ML IV SOLN
5.0000 mg | Freq: Four times a day (QID) | INTRAVENOUS | Status: DC | PRN
  Administered 2020-08-09: 5 mg via INTRAVENOUS
  Filled 2020-08-09: qty 5

## 2020-08-09 MED ORDER — FUROSEMIDE 10 MG/ML IJ SOLN
120.0000 mg | Freq: Two times a day (BID) | INTRAVENOUS | Status: DC
  Administered 2020-08-09 – 2020-08-15 (×12): 120 mg via INTRAVENOUS
  Filled 2020-08-09 (×8): qty 12
  Filled 2020-08-09 (×2): qty 10
  Filled 2020-08-09 (×4): qty 12
  Filled 2020-08-09: qty 10
  Filled 2020-08-09: qty 12

## 2020-08-09 MED ORDER — ALBUTEROL SULFATE (2.5 MG/3ML) 0.083% IN NEBU: 2.5000 mg | INHALATION_SOLUTION | RESPIRATORY_TRACT | Status: DC | PRN

## 2020-08-09 MED ORDER — PANCRELIPASE (LIP-PROT-AMYL) 12000-38000 UNITS PO CPEP
12000.0000 [IU] | ORAL_CAPSULE | Freq: Two times a day (BID) | ORAL | Status: DC
  Administered 2020-08-09 – 2020-08-15 (×11): 12000 [IU] via ORAL
  Filled 2020-08-09 (×12): qty 1

## 2020-08-09 MED ORDER — ENOXAPARIN SODIUM 300 MG/3ML IJ SOLN
170.0000 mg | INTRAMUSCULAR | Status: DC
  Administered 2020-08-09: 170 mg via SUBCUTANEOUS
  Filled 2020-08-09 (×4): qty 1.7

## 2020-08-09 MED ORDER — CALCITRIOL 0.25 MCG PO CAPS
0.2500 ug | ORAL_CAPSULE | Freq: Every day | ORAL | Status: DC
  Administered 2020-08-10 – 2020-08-15 (×6): 0.25 ug via ORAL
  Filled 2020-08-09 (×6): qty 1

## 2020-08-09 MED ORDER — CHLORHEXIDINE GLUCONATE CLOTH 2 % EX PADS
6.0000 | MEDICATED_PAD | Freq: Every day | CUTANEOUS | Status: DC
  Administered 2020-08-10 – 2020-08-13 (×4): 6 via TOPICAL

## 2020-08-09 NOTE — Consult Note (Signed)
[facility] Renal Consultation Note  Requesting MD: Heath Lark, DO Indication for Consultation:  Advanced CKD  Chief complaint: shortness of breath and swelling  HPI:  Julie [name] is a 70 y.o. female with a history of CKD stage V, diabetes mellitus, hypertension, and sleep apnea who presented to her nephrologist, Dr. Theador Hawthorne, on 4/20.  She has a fistula in place which has not yet mature and was recently revised last week at [facility] - not ready to use yet.  She indicated at the clinic appointment that she was short of breath and very swollen - progressing over the past week.  She has been taking torsemide 150 mg daily at home.  She was directed to the ER from clinic and her nephrologist also discussed with the patient that she would likely need dialysis.  Nephrology is consulted for assistance with management of chronic kidney disease.  She has been swollen and short of breath with exertion.  It's hard to walk.  She has to go up 13 steps at home and is out of breath at top.  She's been taking ibuprofen 600 mg once a week at night - she tries to limit or avoid but deals with chronic pain.  She has had CKD since 2022 per nephrology charting and her CKD is felt secondary to diabetes.  BUN 120 on 4/21.  She got lasix 40 mg IV and lasix 20 mg IV on 4/20 here and in the interim she has been transitioned here to lasix 120 mg IV every 12 hours as of this am.  She has had a purewick in.  She has just had 950 mL uop and 1 unmeasured void today.  We discussed risks/benefits/indications of dialysis and she agrees to start HD.  She had a lower extremity duplex which did not demonstrate a DVT but was limited due to habitus.  Creat  Date/Time Value Ref Range Status  08/31/2019 02:45 PM 4.65 (H) 0.50 - 0.99 mg/dL Final    Comment:    For patients >48 years of age, the reference limit for Creatinine is approximately 13% higher for people identified as African-American. .    Creatinine, Ser   Date/Time Value Ref Range Status  08/09/2020 05:00 AM 3.89 (H) 0.44 - 1.00 mg/dL Final  08/08/2020 01:05 PM 4.40 (H) 0.44 - 1.00 mg/dL Final     PMHx:   Past Medical History:  Diagnosis Date  . CHF (congestive heart failure) ([place])   . CKD (chronic kidney disease)   . Diabetes mellitus without complication ([place])   . Hypertension   . Hypothyroidism   . Sleep apnea     Past Surgical History:  Procedure Laterality Date  . AV FISTULA PLACEMENT    . BREAST SURGERY Bilateral    mastectomy   . CHOLECYSTECTOMY    . HYSTEROSCOPY WITH D & C     x2  . VEIN SURGERY Left    vein transferred     Family Hx:  Family History  Problem Relation Age of Onset  . Diabetes Mother   . Diabetes Father   . Diabetes Brother   . Diabetes Sister   . Diabetes Sister   . Thyroid disease Sister   . Diabetes Son   . Healthy Son   . Healthy Son   . Healthy Son   . Healthy Daughter     Social History:  reports that she has never smoked. She has never used smokeless tobacco. She reports previous alcohol use. She reports  that she does not use drugs.  Allergies:  Allergies  Allergen Reactions  . Ace Inhibitors   . Lortab [Hydrocodone-Acetaminophen]     Medications: Prior to Admission medications   Medication Sig Start Date End Date Taking? Authorizing Provider  cholecalciferol (VITAMIN D3) 25 MCG (1000 UNIT) tablet Take 1,000 Units by mouth daily.   Yes [provider]  ELIQUIS 5 MG TABS tablet Take 1 tablet by mouth 2 (two) times daily. 07/30/20  Yes [provider]  gabapentin (NEURONTIN) 100 MG capsule Take 100 mg by mouth 3 (three) times daily.   Yes [provider]  hydrOXYzine (ATARAX/VISTARIL) 25 MG tablet Take 25 mg by mouth daily as needed.    Yes [provider]  insulin aspart (NOVOLOG) 100 UNIT/ML injection Inject 10 Units into the skin 3 (three) times daily before meals. Sliding scale   Yes [provider]  insulin degludec (TRESIBA  FLEXTOUCH) 100 UNIT/ML FlexTouch Pen Inject 22 Units into the skin at bedtime. 08/02/19  Yes [provider]  lipase/protease/amylase (CREON) 12000 units CPEP capsule Take 10,440 Units by mouth 2 (two) times daily.   Yes [provider]  meclizine (ANTIVERT) 12.5 MG tablet Take 12.5 mg by mouth every 6 (six) hours as needed. 06/29/20  Yes [provider]  metoprolol succinate (TOPROL-XL) 50 MG 24 hr tablet Take 50 mg by mouth 2 (two) times daily. 05/14/20  Yes [provider]  OZEMPIC, 0.25 OR 0.5 MG/DOSE, 2 MG/1.5ML SOPN Inject 0.25 mg into the skin 2 (two) times a week. 07/19/19  Yes [provider]  pantoprazole (PROTONIX) 40 MG tablet Take 40 mg by mouth daily.   Yes [provider]  sodium bicarbonate 650 MG tablet Take 650 mg by mouth daily as needed.    Yes [provider]  torsemide (DEMADEX) 100 MG tablet Take 150 mg by mouth daily. 04/18/20  Yes [provider]  aspirin EC 81 MG tablet Take 81 mg by mouth daily. Patient not taking: No sig reported    [provider]  calcitRIOL (ROCALTROL) 0.25 MCG capsule Take 0.25 mcg by mouth daily. Patient not taking: No sig reported    [provider]  cyclobenzaprine (FLEXERIL) 5 MG tablet Take 5 mg by mouth 3 (three) times daily as needed for muscle spasms. Patient not taking: No sig reported    [provider]  metoprolol tartrate (LOPRESSOR) 25 MG tablet Take 25 mg by mouth 2 (two) times daily. Patient not taking: Reported on 08/08/2020    [provider]  predniSONE (DELTASONE) 5 MG tablet Take 4 tabs po qd x 4 days, 3  tabs po qd x 4 days, 2  tabs po qd x 4 days, 1  tab po qd x 4 days Patient not taking: No sig reported 08/31/19   Bo Merino, MD  traMADol (ULTRAM) 50 MG tablet Take 50 mg by mouth every 6 (six) hours as needed. Patient not taking: Reported on 08/08/2020 08/03/20   [provider]    I have reviewed the patient's  current and reported prior to admission medications.  Labs:  BMP Latest Ref Rng & Units 08/09/2020 08/08/2020 08/31/2019  Glucose 70 - 99 mg/dL 142(H) 131(H) 212(H)  BUN 8 - 23 mg/dL 120(H) 128(H) 79(H)  Creatinine 0.44 - 1.00 mg/dL 3.89(H) 4.40(H) 4.65(H)  BUN/Creat Ratio 6 - 22 (calc) - - 17  Sodium 135 - 145 mmol/L 140 136 140  Potassium 3.5 - 5.1 mmol/L 4.0 4.0 5.2  Chloride 98 - 111 mmol/L 102 98 99  CO2 22 - 32 mmol/L 25 25 32  Calcium 8.9 - 10.3 mg/dL 8.2(L) 8.0(L) 9.2    ROS: Pertinent items noted in HPI and remainder of comprehensive ROS otherwise negative.  Physical Exam: Vitals:   08/09/20 0807 08/09/20 1333  BP: 120/80 118/70  Pulse: 83 78  Resp: 18 16  Temp: 98.2 F (36.8 C) 98.6 F (37 C)  SpO2: 99% 97%     General: adult female in bed obese habitus HEENT: NCAT Eyes: EOMI sclera anicteric Neck: supple trachea midline Heart: S1S2 no rub Lungs: clear to auscultation but reduced breath sounds  Abdomen: soft/obese habitus/nt; normal bowel sounds Extremities: 1-2+ edema right lower extremity which Is also somewhat warm; 1+ edema left leg Skin: skin warmer over right leg  Neuro:  Alert and oriented x 3 provides hx and follows commands Psych normal mood and affect Access: LUE AVF healing incision with bruit and thrill   Assessment/Plan:  # CKD stage V - AVF is not ready for use  - Will consult surgery in AM for tunneled catheter for hemodialysis - Start HD on 4/22 after dialysis access obtained  - Will Make NPO after midnight - Will need to set up outpatient dialysis unit    # Acute on chronic systolic CHF - optimize volume status with diuretics as able and we are transitioning to HD   # Right > left leg swelling - optimize volume status with diuretics as able and we are transitioning to HD  - appreciate duplex   # HTN  - controlled   # Afib - rate control per primary team  - note eliquis on home for anticipated tunneled dialysis catheter  #  Secondary hyperpara - intact PTH of 545 on 07/23/20 - calcitriol 0.25 mcg daily listed as home med - continue here and will be transitioned to outpatient administration with dialysis  # Anemia CKD  - No urgent need for PRBC's - anticipate need for ESA  Claudia Desanctis 08/09/2020, 6:40 PM

## 2020-08-09 NOTE — Progress Notes (Signed)
*  PRELIMINARY RESULTS* Echocardiogram 2D Echocardiogram has been performed.  Leavy Cella 08/09/2020, 12:05 PM

## 2020-08-09 NOTE — Progress Notes (Addendum)
ANTICOAGULATION CONSULT NOTE - Initial Consult  Pharmacy Consult for lovenox Indication: atrial fibrillation  Allergies  Allergen Reactions  . Ace Inhibitors   . Lortab [Hydrocodone-Acetaminophen]     Patient Measurements: Height: '5\' 8"'$  (172.7 cm) Weight: (!) 170.8 kg (376 lb 8.7 oz) IBW/kg (Calculated) : 63.9 Heparin Dosing Weight:   Vital Signs: Temp: 98.2 F (36.8 C) (04/21 0807) Temp Source: Oral (04/21 0807) BP: 120/80 (04/21 0807) Pulse Rate: 83 (04/21 0807)  Labs: Recent Labs    08/08/20 1305 08/09/20 0500  HGB 9.7*  --   HCT 32.2*  --   PLT 245  --   CREATININE 4.40* 3.89*    Estimated Creatinine Clearance: 23 mL/min (A) (by C-G formula based on SCr of 3.89 mg/dL (H)).   Medical History: Past Medical History:  Diagnosis Date  . CHF (congestive heart failure) ([place])   . CKD (chronic kidney disease)   . Diabetes mellitus without complication ([place])   . Hypertension   . Hypothyroidism   . Sleep apnea     Medications:  Medications Prior to Admission  Medication Sig Dispense Refill Last Dose  . cholecalciferol (VITAMIN D3) 25 MCG (1000 UNIT) tablet Take 1,000 Units by mouth daily.   Past Month at Unknown time  . ELIQUIS 5 MG TABS tablet Take 1 tablet by mouth 2 (two) times daily.   08/07/2020 at 1700  . gabapentin (NEURONTIN) 100 MG capsule Take 100 mg by mouth 3 (three) times daily.   08/08/2020 at Unknown time  . hydrOXYzine (ATARAX/VISTARIL) 25 MG tablet Take 25 mg by mouth daily as needed.    Past Week at Unknown time  . insulin aspart (NOVOLOG) 100 UNIT/ML injection Inject 10 Units into the skin 3 (three) times daily before meals. Sliding scale   08/08/2020 at Unknown time  . insulin degludec (TRESIBA FLEXTOUCH) 100 UNIT/ML FlexTouch Pen Inject 22 Units into the skin at bedtime.   08/07/2020 at Unknown time  . lipase/protease/amylase (CREON) 12000 units CPEP capsule Take 10,440 Units by mouth 2 (two) times daily.   Past Week at Unknown time  . meclizine  (ANTIVERT) 12.5 MG tablet Take 12.5 mg by mouth every 6 (six) hours as needed.   08/08/2020 at Unknown time  . metoprolol succinate (TOPROL-XL) 50 MG 24 hr tablet Take 50 mg by mouth 2 (two) times daily.   08/08/2020 at 1700  . OZEMPIC, 0.25 OR 0.5 MG/DOSE, 2 MG/1.5ML SOPN Inject 0.25 mg into the skin 2 (two) times a week.   08/08/2020 at Unknown time  . pantoprazole (PROTONIX) 40 MG tablet Take 40 mg by mouth daily.   08/08/2020 at Unknown time  . sodium bicarbonate 650 MG tablet Take 650 mg by mouth daily as needed.      . torsemide (DEMADEX) 100 MG tablet Take 150 mg by mouth daily.   08/07/2020 at Unknown time  . aspirin EC 81 MG tablet Take 81 mg by mouth daily. (Patient not taking: No sig reported)   Not Taking at Unknown time  . calcitRIOL (ROCALTROL) 0.25 MCG capsule Take 0.25 mcg by mouth daily. (Patient not taking: No sig reported)   Not Taking at Unknown time  . cyclobenzaprine (FLEXERIL) 5 MG tablet Take 5 mg by mouth 3 (three) times daily as needed for muscle spasms. (Patient not taking: No sig reported)   Not Taking at Unknown time  . metoprolol tartrate (LOPRESSOR) 25 MG tablet Take 25 mg by mouth 2 (two) times daily. (Patient not taking: Reported on 08/08/2020)  Not Taking  . predniSONE (DELTASONE) 5 MG tablet Take 4 tabs po qd x 4 days, 3  tabs po qd x 4 days, 2  tabs po qd x 4 days, 1  tab po qd x 4 days (Patient not taking: No sig reported) 40 tablet 0   . torsemide (DEMADEX) 20 MG tablet Take 20 mg by mouth daily. (Patient not taking: Reported on 08/08/2020)   Not Taking at Unknown time  . traMADol (ULTRAM) 50 MG tablet Take 50 mg by mouth every 6 (six) hours as needed. (Patient not taking: Reported on 08/08/2020)   Not Taking at Unknown time    Assessment: Pharmacy consulted to dose lovenox in patient with atrial fibrillation.  Patient on apixaban prior to admission with last dose administered 4/21 '@0850'$ .  Will start lovenox when next dose of apixaban would have been due.  Goal of  Therapy:   Monitor platelets by anticoagulation protocol: Yes   Plan:  Lovenox 170 mg subq every 24 hours starting '@2100'$ . Monitor labs and s/s of bleeding.  Ramond Craver 08/09/2020,12:29 PM

## 2020-08-09 NOTE — TOC Initial Note (Signed)
Transition of Care [facility]) - Initial/Assessment Note    Patient Details  Name: Julie [name] MRN: [id number] Date of Birth: Apr 04, 1951  [facility] CM/SW Contact:    Iona Beard, [place] Phone Number: 08/09/2020, 1:29 PM  Clinical Narrative:                 [facility] consulted for CHF consult. CSW spoke with pt to complete assessment. Pt states her son lives with her. Pt states she is normally able to complete ADLs independently however, before she came into the hospital she began having difficulty. Pt provides her own transportation. Pt states she has had [place] services in the past. Pt states that she has a cane to use when needed.   CSW completed CHF screen with pt. Pt states that she does not weigh herself daily. CSW educated pt on importance of this. Pt does not follow a heart healthy diet but was educated on the importance of keeping to a low fat and low sodium diet. Pt states she takes all medications as prescribed. Pt states that she is followed by 2 cardiologists.   TOC to follow for possible d/c needs.   Expected Discharge Plan: Home/Self Care Barriers to Discharge: Continued Medical Work up   Patient Goals and CMS Choice Patient states their goals for this hospitalization and ongoing recovery are:: Return home CMS Medicare.gov Compare Post Acute Care list provided to:: Patient Choice offered to / list presented to : Patient  Expected Discharge Plan and Services Expected Discharge Plan: Home/Self Care In-house Referral: Clinical Social Work Discharge Planning Services: CM Consult Post Acute Care Choice: NA Living arrangements for the past 2 months: Single Family Home                                      Prior Living Arrangements/Services Living arrangements for the past 2 months: Single Family Home Lives with:: Adult Children Patient language and need for interpreter reviewed:: Yes Do you feel safe going back to the place where you live?: Yes      Need  for Family Participation in Patient Care: No (Comment) Care giver support system in place?: Yes (comment)   Criminal Activity/Legal Involvement Pertinent to Current Situation/Hospitalization: No - Comment as needed  Activities of Daily Living Home Assistive Devices/Equipment: Cane (specify quad or straight) ADL Screening (condition at time of admission) Patient's cognitive ability adequate to safely complete daily activities?: Yes Is the patient deaf or have difficulty hearing?: No Does the patient have difficulty seeing, even when wearing glasses/contacts?: No Does the patient have difficulty concentrating, remembering, or making decisions?: No Patient able to express need for assistance with ADLs?: Yes Does the patient have difficulty dressing or bathing?: Yes Independently performs ADLs?: No Communication: Independent Dressing (OT): Needs assistance Is this a change from baseline?: Change from baseline, expected to last >3 days Grooming: Needs assistance Is this a change from baseline?: Change from baseline, expected to last >3 days Feeding: Independent Bathing: Needs assistance Is this a change from baseline?: Change from baseline, expected to last >3 days Toileting: Needs assistance Is this a change from baseline?: Change from baseline, expected to last >3days In/Out Bed: Needs assistance Is this a change from baseline?: Change from baseline, expected to last >3 days Walks in Home: Independent with device (comment) (cane) Does the patient have difficulty walking or climbing stairs?: Yes Weakness of Legs: Both Weakness of  Arms/Hands: None  Permission Sought/Granted                  Emotional Assessment Appearance:: Appears stated age Attitude/Demeanor/Rapport: Engaged Affect (typically observed): Accepting Orientation: : Oriented to Self,Oriented to Place,Oriented to  Time,Oriented to Situation Alcohol / Substance Use: Not Applicable Psych Involvement: No  (comment)  Admission diagnosis:  SOB (shortness of breath) [R06.02] Uremia due to inadequate renal perfusion [N19] Stage 5 chronic kidney disease not on chronic dialysis (HCC) [N18.5] Decompensated heart failure ([place]) [I50.9] Patient Active Problem List   Diagnosis Date Noted  . Decompensated heart failure ([place]) 08/08/2020  . CKD (chronic kidney disease) stage 4, GFR 15-29 ml/min (HCC) 08/26/2019  . OSA (obstructive sleep apnea) 08/26/2019  . History of hypothyroidism 08/26/2019  . History of type 2 diabetes mellitus 08/26/2019  . Hypertensive heart disease with chronic systolic congestive heart failure ([place]) 08/26/2019  . Chronic systolic CHF (congestive heart failure) ([place]) 08/26/2019  . History of breast cancer 08/26/2019  . History of gastroesophageal reflux (GERD) 08/26/2019  . Secondary hyperparathyroidism ([place]) 08/26/2019  . Anemia due to stage 5 chronic kidney disease, not on chronic dialysis ([place]) 08/26/2019  . A-V fistula ([place]) 08/26/2019   PCP:  Rosita Fire, MD Pharmacy:   [place], [place] [address] [place] [address] Phone: [phone] Fax: [phone]     Social Determinants of Health (SDOH) Interventions    Readmission Risk Interventions Readmission Risk Prevention Plan 08/09/2020  Transportation Screening Complete  Home Care Screening Complete  Medication Review (RN CM) Complete  Some recent data might be hidden

## 2020-08-09 NOTE — Progress Notes (Signed)
PROGRESS NOTE    SHARANE DEIBEL  [id number] DOB: 02/25/1951 DOA: 08/08/2020 PCP: Rosita Fire, MD   Brief Narrative:   Julie [name] is a 70 y.o. female with medical history significant for CKD 5, OSA, diabetes mellitus, hypothyroidism, systolic CHF, hypertension and obstructive sleep apnea. Patient presented to the ED with complaints of leg swelling extending up to her thighs and difficulty breathing.  Patient saw her nephrologist Dr. Theador Hawthorne on the day of admission and was referred to the ED.  Assessment & Plan:   Principal Problem:   Decompensated heart failure (HCC) Active Problems:   OSA (obstructive sleep apnea)   History of hypothyroidism   History of type 2 diabetes mellitus   Chronic systolic CHF (congestive heart failure) (HCC)   Anemia due to stage 5 chronic kidney disease, not on chronic dialysis (HCC)   A-V fistula (HCC)   Acute on chronic systolic congestive heart failure decompensation -Repeat 2D echocardiogram with LVEF 40% which is similar to prior at approximately 45% on 10/21 -LV dysfunction again noted -IV Lasix increased to 120 mg twice daily -Continue to monitor strict I's and O's as well as daily weights -She has had approximately 1 L of urine output documented since admission  CKD 5 -Severe volume overload as noted above -Appreciate nephrology consultation to assist with diuresis/potential start of hemodialysis -Patient has left upper extremity fistula that was recently placed on 4/15 per patient  Atrial fibrillation -On Eliquis at home which has been held in case PermCath placement is needed -Continue to hold dose Lovenox -Continue metoprolol -Telemetry monitoring and IV metoprolol as needed for episodes of RVR  Left hip pain -Pelvic x-ray with no acute abnormality -Likely sciatica versus bursitis -Continue oral pain medications as needed -PT evaluation prior to DC -Typically ambulates with cane  Type 2 diabetes -Hemoglobin A1c  7.4% -Continue SSI -Holding home Ozempic  Chronic anemia -Currently at baseline -Likely anemia of chronic disease related to CKD  Hypertension-stable -Continue home metoprolol -Monitor closely on aggressive IV Lasix diuresis  Morbid obesity with OSA -Lifestyle changes outpatient -CPAP at bedtime   DVT prophylaxis: Full dose Lovenox Code Status: Full Family Communication: None at bedside Disposition Plan:  Status is: Inpatient  Remains inpatient appropriate because:IV treatments appropriate due to intensity of illness or inability to take PO and Inpatient level of care appropriate due to severity of illness   Dispo: The patient is from: Home              Anticipated d/c is to: Home              Patient currently is not medically stable to d/c.   Difficult to place patient No  Consultants:   Nephrology  Procedures:   See below  Antimicrobials:   None   Subjective: Patient seen and evaluated today with no new acute complaints or concerns. No acute concerns or events noted overnight.  She states that her shortness of breath has improved since admission.  Objective: Vitals:   08/09/20 0145 08/09/20 0451 08/09/20 0500 08/09/20 0807  BP: 117/77 117/78  120/80  Pulse: (!) 117 (!) 119  83  Resp:  20  18  Temp: 97.9 F (36.6 C) 98 F (36.7 C)  98.2 F (36.8 C)  TempSrc: Oral Oral  Oral  SpO2: 98% 100%  99%  Weight:   (!) 170.8 kg   Height:        Intake/Output Summary (Last 24 hours) at 08/09/2020 1234 Last data filed  at 08/09/2020 0900 Gross per 24 hour  Intake 240 ml  Output 1000 ml  Net -760 ml   Filed Weights   08/08/20 1948 08/08/20 2257 08/09/20 0500  Weight: (!) 169.9 kg (!) 170.1 kg (!) 170.8 kg    Examination:  General exam: Appears calm and comfortable, morbidly obese Respiratory system: Clear to auscultation. Respiratory effort normal.  Currently on room air Cardiovascular system: S1 & S2 heard, RRR.  Gastrointestinal system: Abdomen is  soft, obese Central nervous system: Alert and awake Extremities: Scant pitting edema noted diffusely in lower extremities bilaterally Skin: No significant lesions noted Psychiatry: Flat affect.    Data Reviewed: I have personally reviewed following labs and imaging studies  CBC: Recent Labs  Lab 08/08/20 1305  WBC 10.6*  HGB 9.7*  HCT 32.2*  MCV 95.8  PLT 99991111   Basic Metabolic Panel: Recent Labs  Lab 08/08/20 1305 08/09/20 0500  NA 136 140  K 4.0 4.0  CL 98 102  CO2 25 25  GLUCOSE 131* 142*  BUN 128* 120*  CREATININE 4.40* 3.89*  CALCIUM 8.0* 8.2*   GFR: Estimated Creatinine Clearance: 23 mL/min (A) (by C-G formula based on SCr of 3.89 mg/dL (H)). Liver Function Tests: No results for input(s): AST, ALT, ALKPHOS, BILITOT, PROT, ALBUMIN in the last 168 hours. No results for input(s): LIPASE, AMYLASE in the last 168 hours. No results for input(s): AMMONIA in the last 168 hours. Coagulation Profile: No results for input(s): INR, PROTIME in the last 168 hours. Cardiac Enzymes: No results for input(s): CKTOTAL, CKMB, CKMBINDEX, TROPONINI in the last 168 hours. BNP (last 3 results) No results for input(s): PROBNP in the last 8760 hours. HbA1C: No results for input(s): HGBA1C in the last 72 hours. CBG: Recent Labs  Lab 08/08/20 1806 08/08/20 2204 08/09/20 0526 08/09/20 0727 08/09/20 1123  GLUCAP 87 109* 147* 128* 194*   Lipid Profile: No results for input(s): CHOL, HDL, LDLCALC, TRIG, CHOLHDL, LDLDIRECT in the last 72 hours. Thyroid Function Tests: No results for input(s): TSH, T4TOTAL, FREET4, T3FREE, THYROIDAB in the last 72 hours. Anemia Panel: No results for input(s): VITAMINB12, FOLATE, FERRITIN, TIBC, IRON, RETICCTPCT in the last 72 hours. Sepsis Labs: No results for input(s): PROCALCITON, LATICACIDVEN in the last 168 hours.  Recent Results (from the past 240 hour(s))  Resp Panel by RT-PCR (Flu A&B, Covid) Nasopharyngeal Swab     Status: None    Collection Time: 08/08/20  4:30 PM   Specimen: Nasopharyngeal Swab; Nasopharyngeal(NP) swabs in vial transport medium  Result Value Ref Range Status   SARS Coronavirus 2 by RT PCR NEGATIVE NEGATIVE Final    Comment: (NOTE) SARS-CoV-2 target nucleic acids are NOT DETECTED.  The SARS-CoV-2 RNA is generally detectable in upper respiratory specimens during the acute phase of infection. The lowest concentration of SARS-CoV-2 viral copies this assay can detect is 138 copies/mL. A negative result does not preclude SARS-Cov-2 infection and should not be used as the sole basis for treatment or other patient management decisions. A negative result may occur with  improper specimen collection/handling, submission of specimen other than nasopharyngeal swab, presence of viral mutation(s) within the areas targeted by this assay, and inadequate number of viral copies(<138 copies/mL). A negative result must be combined with clinical observations, patient history, and epidemiological information. The expected result is Negative.  Fact Sheet for Patients:  EntrepreneurPulse.com.au  Fact Sheet for Healthcare Providers:  IncredibleEmployment.be  This test is no t yet approved or cleared by the [place] FDA  and  has been authorized for detection and/or diagnosis of SARS-CoV-2 by FDA under an Emergency Use Authorization (EUA). This EUA will remain  in effect (meaning this test can be used) for the duration of the COVID-19 declaration under Section 564(b)(1) of the Act, 21 U.S.C.section 360bbb-3(b)(1), unless the authorization is terminated  or revoked sooner.       Influenza A by PCR NEGATIVE NEGATIVE Final   Influenza B by PCR NEGATIVE NEGATIVE Final    Comment: (NOTE) The Xpert Xpress SARS-CoV-2/FLU/RSV plus assay is intended as an aid in the diagnosis of influenza from Nasopharyngeal swab specimens and should not be used as a sole basis for treatment.  Nasal washings and aspirates are unacceptable for Xpert Xpress SARS-CoV-2/FLU/RSV testing.  Fact Sheet for Patients: EntrepreneurPulse.com.au  Fact Sheet for Healthcare Providers: IncredibleEmployment.be  This test is not yet approved or cleared by the [place] FDA and has been authorized for detection and/or diagnosis of SARS-CoV-2 by FDA under an Emergency Use Authorization (EUA). This EUA will remain in effect (meaning this test can be used) for the duration of the COVID-19 declaration under Section 564(b)(1) of the Act, 21 U.S.C. section 360bbb-3(b)(1), unless the authorization is terminated or revoked.  Performed at [facility], [address], [place], [place] [address]          Radiology Studies: CT Chest Wo Contrast  Result Date: 08/08/2020 CLINICAL DATA:  Abnormal x-ray EXAM: CT CHEST WITHOUT CONTRAST TECHNIQUE: Multidetector CT imaging of the chest was performed following the standard protocol without IV contrast. COMPARISON:  Chest x-ray today. FINDINGS: Cardiovascular: Cardiomegaly. Dense mitral valve annular calcifications. Scattered aortic calcifications. No aneurysm. Mediastinum/Nodes: No mediastinal, hilar, or axillary adenopathy. Trachea and esophagus are unremarkable. Lungs/Pleura: No confluent opacities or effusions. Mild vascular congestion. Upper Abdomen: Prior cholecystectomy. Enlarged left hepatic lobe nodular contours suggesting cirrhosis. Musculoskeletal: Chest wall soft tissues are unremarkable. No acute bony abnormality. IMPRESSION: Cardiomegaly, vascular congestion. No confluent airspace opacities or effusions. Appearance of the liver suggests cirrhosis. Recommend clinical correlation. Electronically Signed   By: Rolm Baptise M.D.   On: 08/08/2020 15:46   US Venous Img Lower Bilateral (DVT)  Result Date: 08/08/2020 CLINICAL DATA:  Shortness of breath. EXAM: BILATERAL LOWER EXTREMITY VENOUS DOPPLER ULTRASOUND  TECHNIQUE: Gray-scale sonography with graded compression, as well as color Doppler and duplex ultrasound were performed to evaluate the lower extremity deep venous systems from the level of the common femoral vein and including the common femoral, femoral, profunda femoral, popliteal and calf veins including the posterior tibial, peroneal and gastrocnemius veins when visible. The superficial great saphenous vein was also interrogated. Spectral Doppler was utilized to evaluate flow at rest and with distal augmentation maneuvers in the common femoral, femoral and popliteal veins. COMPARISON:  None. FINDINGS: RIGHT LOWER EXTREMITY Common Femoral Vein: No evidence of thrombus. Normal compressibility, respiratory phasicity and response to augmentation. Saphenofemoral Junction: No evidence of thrombus. Normal compressibility and flow on color Doppler imaging. Profunda Femoral Vein: No evidence of thrombus. Normal compressibility and flow on color Doppler imaging. Femoral Vein: No evidence of thrombus, although the distal vein is not visualized. Visualized portions of the vein demonstrate compressibility, respiratory phasicity and response to augmentation. Popliteal Vein: No evidence of thrombus. Normal compressibility, respiratory phasicity and response to augmentation. Calf Veins: No evidence of thrombus in the visualized calf veins. The peroneal vein is not visualized. Normal compressibility and flow on color Doppler imaging of the visualized calf veins. Superficial Great Saphenous Vein: No evidence of thrombus. Normal  compressibility. Venous Reflux:  None. LEFT LOWER EXTREMITY Common Femoral Vein: No evidence of thrombus. Normal compressibility, respiratory phasicity and response to augmentation. Saphenofemoral Junction: No evidence of thrombus. Normal compressibility and flow on color Doppler imaging. Profunda Femoral Vein: No evidence of thrombus. Normal compressibility and flow on color Doppler imaging. Femoral  Vein: No evidence of thrombus, although the distal vein is not visualized. Visualized portions of the vein demonstrate compressibility, respiratory phasicity and response to augmentation. Popliteal Vein: Not visualized. Calf Veins: No evidence of thrombus in the visualized calf veins. The peroneal vein is not visualized. Normal compressibility and flow on color Doppler imaging of the visualized calf veins. Superficial Great Saphenous Vein: No evidence of thrombus. Normal compressibility. Venous Reflux:  None. IMPRESSION: Significantly limited evaluation due to patient body habitus with multiple veins not visualized. Within this limitation, no evidence of DVT. Electronically Signed   By: Margaretha Sheffield MD   On: 08/08/2020 14:23   DG Chest Port 1 View  Result Date: 08/08/2020 CLINICAL DATA:  Shortness of breath. EXAM: PORTABLE CHEST 1 VIEW COMPARISON:  Aug 31, 2019. FINDINGS: Stable cardiomegaly. No pneumothorax is noted. Right lung is clear. Mild left basilar atelectasis or infiltrate is noted with possible associated effusion. Bony thorax is unremarkable. IMPRESSION: Left basilar opacity as described above. Electronically Signed   By: Marijo Conception M.D.   On: 08/08/2020 13:23   ECHOCARDIOGRAM COMPLETE  Result Date: 08/09/2020    ECHOCARDIOGRAM REPORT   Patient Name:   Julie [name] Date of Exam: 08/09/2020 Medical Rec #:  [id number]      Height:       68.0 in Accession #:    [id number]     Weight:       376.5 lb Date of Birth:  Jun 06, 1950       BSA:          2.674 m Patient Age:    31 years       BP:           120/80 mmHg Patient Gender: F              HR:           83 bpm. Exam Location:  [facility] Procedure: 2D Echo Indications:    CHF-Acute Systolic [id number]  History:        Patient has no prior history of Echocardiogram examinations.                 CHF; Risk Factors:Non-Smoker and Diabetes. AV Fistula, Breast                 Cancer.  Sonographer:    Leavy Cella RDCS (AE) Referring Phys: [place]  1. Left ventricular ejection fraction, by estimation, is 40%. The left ventricle has moderately decreased function. The left ventricle demonstrates global hypokinesis. There is mild left ventricular hypertrophy. Left ventricular diastolic parameters are  indeterminate.  2. Right ventricular systolic function is normal. The right ventricular size is normal.  3. Left atrial size was mildly dilated.  4. Right atrial size was mildly dilated.  5. Poorly visualized MR by color Doppler, by CW spectrum waveform would suggest at least moderate MR. The mitral valve is abnormal. Mild to moderate mitral valve regurgitation. No evidence of mitral stenosis.  6. The tricuspid valve is abnormal. Tricuspid valve regurgitation is mild to moderate.  7. The aortic valve is tricuspid. There is mild calcification of the aortic valve. There  is mild thickening of the aortic valve. Aortic valve regurgitation is not visualized. No aortic stenosis is present.  8. The inferior vena cava is dilated in size with >50% respiratory variability, suggesting right atrial pressure of 8 mmHg. FINDINGS  Left Ventricle: Left ventricular ejection fraction, by estimation, is 40%. The left ventricle has moderately decreased function. The left ventricle demonstrates global hypokinesis. The left ventricular internal cavity size was normal in size. There is mild left ventricular hypertrophy. Left ventricular diastolic parameters are indeterminate. Right Ventricle: The right ventricular size is normal. No increase in right ventricular wall thickness. Right ventricular systolic function is normal. Left Atrium: Left atrial size was mildly dilated. Right Atrium: Right atrial size was mildly dilated. Pericardium: There is no evidence of pericardial effusion. Mitral Valve: Poorly visualized MR by color Doppler, by CW spectrum waveform would suggest at least moderate MR. The mitral valve is abnormal. There is mild thickening of the  mitral valve leaflet(s). There is mild calcification of the mitral valve leaflet(s). Mild mitral annular calcification. Mild to moderate mitral valve regurgitation. No evidence of mitral valve stenosis. Tricuspid Valve: The tricuspid valve is abnormal. Tricuspid valve regurgitation is mild to moderate. No evidence of tricuspid stenosis. Aortic Valve: The aortic valve is tricuspid. There is mild calcification of the aortic valve. There is mild thickening of the aortic valve. There is mild aortic valve annular calcification. Aortic valve regurgitation is not visualized. No aortic stenosis  is present. Aortic valve mean gradient measures 4.5 mmHg. Aortic valve peak gradient measures 8.0 mmHg. Aortic valve area, by VTI measures 1.40 cm. Pulmonic Valve: The pulmonic valve was not well visualized. Pulmonic valve regurgitation is mild. No evidence of pulmonic stenosis. Aorta: The aortic root is normal in size and structure. Pulmonary Artery: Moderate pulmonary HTN, PASP is 42 mmHg. Venous: The inferior vena cava is dilated in size with greater than 50% respiratory variability, suggesting right atrial pressure of 8 mmHg. IAS/Shunts: No atrial level shunt detected by color flow Doppler.  LEFT VENTRICLE PLAX 2D LVIDd:         4.63 cm  Diastology LVIDs:         3.64 cm  LV e' medial:    5.57 cm/s LV PW:         1.20 cm  LV E/e' medial:  28.2 LV IVS:        1.23 cm  LV e' lateral:   6.32 cm/s LVOT diam:     1.90 cm  LV E/e' lateral: 24.8 LV SV:         47 LV SV Index:   18 LVOT Area:     2.84 cm  RIGHT VENTRICLE TAPSE (M-mode): 1.8 cm LEFT ATRIUM              Index       RIGHT ATRIUM           Index LA diam:        5.00 cm  1.87 cm/m  RA Area:     25.90 cm LA Vol (A2C):   103.0 ml 38.51 ml/m RA Volume:   84.80 ml  31.71 ml/m LA Vol (A4C):   62.9 ml  23.52 ml/m LA Biplane Vol: 81.6 ml  30.51 ml/m  AORTIC VALVE AV Area (Vmax):    1.67 cm AV Area (Vmean):   1.43 cm AV Area (VTI):     1.40 cm AV Vmax:           141.60  cm/s AV Vmean:  102.397 cm/s AV VTI:            0.335 m AV Peak Grad:      8.0 mmHg AV Mean Grad:      4.5 mmHg LVOT Vmax:         83.65 cm/s LVOT Vmean:        51.588 cm/s LVOT VTI:          0.165 m LVOT/AV VTI ratio: 0.49  AORTA Ao Root diam: 2.70 cm MITRAL VALVE                TRICUSPID VALVE MV Area (PHT): 4.63 cm     TR Peak grad:   34.1 mmHg MV Decel Time: 164 msec     TR Vmax:        292.00 cm/s MR Peak grad: 94.9 mmHg MR Mean grad: 60.0 mmHg     SHUNTS MR Vmax:      487.00 cm/s   Systemic VTI:  0.17 m MR Vmean:     362.0 cm/s    Systemic Diam: 1.90 cm MV E velocity: 157.00 cm/s MV A velocity: 61.60 cm/s MV E/A ratio:  2.55 Carlyle Dolly MD Electronically signed by Carlyle Dolly MD Signature Date/Time: 08/09/2020/12:13:56 PM    Final    DG Hip Unilat W or Wo Pelvis 2-3 Views Left  Result Date: 08/08/2020 CLINICAL DATA:  Left hip pain.  No known injury. EXAM: DG HIP (WITH OR WITHOUT PELVIS) 2-3V LEFT COMPARISON:  None. FINDINGS: There is no evidence of hip fracture or dislocation. There is no evidence of arthropathy or other focal bone abnormality. IMPRESSION: Negative exam. Electronically Signed   By: Inge Rise M.D.   On: 08/08/2020 14:40        Scheduled Meds: . enoxaparin (LOVENOX) injection  170 mg Subcutaneous Q24H  . gabapentin  100 mg Oral TID  . insulin aspart  0-5 Units Subcutaneous QHS  . insulin aspart  0-9 Units Subcutaneous TID WC  . insulin glargine  15 Units Subcutaneous QHS  . lipase/protease/amylase  12,000 Units Oral BID  . metoprolol succinate  50 mg Oral BID  . pantoprazole  40 mg Oral Daily   Continuous Infusions: . furosemide 120 mg (08/09/20 1011)     LOS: 1 day    Time spent: 35 minutes    Dwanda Tufano Darleen Crocker, DO [facility]  If 7PM-7AM, please contact night-coverage [email or web address] 08/09/2020, 12:34 PM

## 2020-08-09 NOTE — Progress Notes (Signed)
Patient does not wish to use CPAP tonight.  She states that it is to loud and she can not tolerate it.  RT will continue to monitor.

## 2020-08-09 NOTE — Progress Notes (Signed)
Patient placed on nasal mask CPAP 8.  Patient tolerating well at this time.  RT will continue to monitor.

## 2020-08-09 NOTE — Progress Notes (Signed)
   08/08/20 1948  Vitals  Temp (!) 97.4 F (36.3 C)  Temp Source Oral  BP 140/79  BP Location Right Arm  BP Method Automatic  Pulse Rate (!) 118  Resp 20  MEWS COLOR  MEWS Score Color Yellow  Oxygen Therapy  SpO2 100 %  O2 Device Room Air  Height and Weight  Height '5\' 8"'$  (1.727 m)  Weight (!) 169.9 kg  Type of Scale Used Bed  Type of Weight Actual  BSA (Calculated - sq m) 2.85 sq meters  BMI (Calculated) 56.97  Weight in (lb) to have BMI = 25 164.1  MEWS Score  MEWS Temp 0  MEWS Systolic 0  MEWS Pulse 2  MEWS RR 0  MEWS LOC 0  MEWS Score 2   MD notified of patient MEWs score of yellow due to HR. Patient is asymptomatic and is resting well. Will continue to monitor as appropriate. Charge nurse notified as well.

## 2020-08-10 LAB — CBC
HCT: 28.3 % — ABNORMAL LOW (ref 36.0–46.0)
Hemoglobin: 8.8 g/dL — ABNORMAL LOW (ref 12.0–15.0)
MCH: 29.5 pg (ref 26.0–34.0)
MCHC: 31.1 g/dL (ref 30.0–36.0)
MCV: 95 fL (ref 80.0–100.0)
Platelets: 243 10*3/uL (ref 150–400)
RBC: 2.98 MIL/uL — ABNORMAL LOW (ref 3.87–5.11)
RDW: 13.6 % (ref 11.5–15.5)
WBC: 7.7 10*3/uL (ref 4.0–10.5)
nRBC: 0 % (ref 0.0–0.2)

## 2020-08-10 LAB — GLUCOSE, CAPILLARY
Glucose-Capillary: 113 mg/dL — ABNORMAL HIGH (ref 70–99)
Glucose-Capillary: 123 mg/dL — ABNORMAL HIGH (ref 70–99)
Glucose-Capillary: 140 mg/dL — ABNORMAL HIGH (ref 70–99)
Glucose-Capillary: 240 mg/dL — ABNORMAL HIGH (ref 70–99)

## 2020-08-10 LAB — BASIC METABOLIC PANEL
Anion gap: 12 (ref 5–15)
BUN: 120 mg/dL — ABNORMAL HIGH (ref 8–23)
CO2: 26 mmol/L (ref 22–32)
Calcium: 7.9 mg/dL — ABNORMAL LOW (ref 8.9–10.3)
Chloride: 99 mmol/L (ref 98–111)
Creatinine, Ser: 3.69 mg/dL — ABNORMAL HIGH (ref 0.44–1.00)
GFR, Estimated: 13 mL/min — ABNORMAL LOW (ref >60)
Glucose, Bld: 158 mg/dL — ABNORMAL HIGH (ref 70–99)
Potassium: 3.8 mmol/L (ref 3.5–5.1)
Sodium: 137 mmol/L (ref 135–145)

## 2020-08-10 MED ORDER — DARBEPOETIN ALFA 100 MCG/0.5ML IJ SOSY
100.0000 ug | PREFILLED_SYRINGE | Freq: Once | INTRAMUSCULAR | Status: AC
  Administered 2020-08-10: 100 ug via SUBCUTANEOUS
  Filled 2020-08-10: qty 0.5

## 2020-08-10 MED ORDER — POTASSIUM CHLORIDE CRYS ER 20 MEQ PO TBCR
20.0000 meq | EXTENDED_RELEASE_TABLET | Freq: Once | ORAL | Status: AC
  Administered 2020-08-10: 20 meq via ORAL
  Filled 2020-08-10: qty 1

## 2020-08-10 NOTE — H&P (View-Only) (Signed)
[facility] Consult  Reason for Consult: ERSD dialysis access  Referring Physician:  Dr. Royce Macadamia   Chief Complaint    Shortness of Breath      HPI: Julie [name] is a 70 y.o. 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 She has been short of breath and swollen over the past week. She is on Eliquis for A fib and received that yesterday in addition to Lovenox therapeutic dosing last night.  She is ready to start dialysis but given the blood thinners we will proceed on Monday with placement to reduce the risk.  She had a lower extremity doppler for leg swelling that was negative for DVT but was limited due to her body habitus.    Past Medical History:  Diagnosis Date  . CHF (congestive heart failure) ([place])   . CKD (chronic kidney disease)   . Diabetes mellitus without complication ([place])   . Hypertension   . Hypothyroidism   . Sleep apnea     Past Surgical History:  Procedure Laterality Date  . AV FISTULA PLACEMENT    . BREAST SURGERY Bilateral    mastectomy   . CHOLECYSTECTOMY    . HYSTEROSCOPY WITH D & C     x2  . VEIN SURGERY Left    vein transferred     Family History  Problem Relation Age of Onset  . Diabetes Mother   . Diabetes Father   . Diabetes Brother   . Diabetes Sister   . Diabetes Sister   . Thyroid disease Sister   . Diabetes Son   . Healthy Son   . Healthy Son   . Healthy Son   . Healthy Daughter     Social History   Tobacco Use  . Smoking status: Never Smoker  . Smokeless tobacco: Never Used  Vaping Use  . Vaping Use: Never used  Substance Use Topics  . Alcohol use: Not Currently  . Drug use: Never    Medications:  I have reviewed the patient's current medications. Prior to Admission:  Medications Prior to Admission  Medication Sig Dispense Refill Last Dose  . cholecalciferol (VITAMIN D3) 25 MCG (1000 UNIT) tablet Take 1,000 Units by mouth daily.   Past Month at Unknown time  . ELIQUIS 5  MG TABS tablet Take 1 tablet by mouth 2 (two) times daily.   08/07/2020 at 1700  . gabapentin (NEURONTIN) 100 MG capsule Take 100 mg by mouth 3 (three) times daily.   08/08/2020 at Unknown time  . hydrOXYzine (ATARAX/VISTARIL) 25 MG tablet Take 25 mg by mouth daily as needed.    Past Week at Unknown time  . insulin aspart (NOVOLOG) 100 UNIT/ML injection Inject 10 Units into the skin 3 (three) times daily before meals. Sliding scale   08/08/2020 at Unknown time  . insulin degludec (TRESIBA FLEXTOUCH) 100 UNIT/ML FlexTouch Pen Inject 22 Units into the skin at bedtime.   08/07/2020 at Unknown time  . lipase/protease/amylase (CREON) 12000 units CPEP capsule Take 10,440 Units by mouth 2 (two) times daily.   Past Week at Unknown time  . meclizine (ANTIVERT) 12.5 MG tablet Take 12.5 mg by mouth every 6 (six) hours as needed.   08/08/2020 at Unknown time  . metoprolol succinate (TOPROL-XL) 50 MG 24 hr tablet Take 50 mg by mouth 2 (two) times daily.   08/08/2020 at 1700  . OZEMPIC, 0.25 OR 0.5 MG/DOSE, 2 MG/1.5ML SOPN Inject 0.25 mg into the skin  2 (two) times a week.   08/08/2020 at Unknown time  . pantoprazole (PROTONIX) 40 MG tablet Take 40 mg by mouth daily.   08/08/2020 at Unknown time  . sodium bicarbonate 650 MG tablet Take 650 mg by mouth daily as needed.      . torsemide (DEMADEX) 100 MG tablet Take 150 mg by mouth daily.   08/07/2020 at Unknown time  . aspirin EC 81 MG tablet Take 81 mg by mouth daily. (Patient not taking: No sig reported)   Not Taking at Unknown time  . calcitRIOL (ROCALTROL) 0.25 MCG capsule Take 0.25 mcg by mouth daily. (Patient not taking: No sig reported)   Not Taking at Unknown time  . cyclobenzaprine (FLEXERIL) 5 MG tablet Take 5 mg by mouth 3 (three) times daily as needed for muscle spasms. (Patient not taking: No sig reported)   Not Taking at Unknown time  . metoprolol tartrate (LOPRESSOR) 25 MG tablet Take 25 mg by mouth 2 (two) times daily. (Patient not taking: Reported on  08/08/2020)   Not Taking  . predniSONE (DELTASONE) 5 MG tablet Take 4 tabs po qd x 4 days, 3  tabs po qd x 4 days, 2  tabs po qd x 4 days, 1  tab po qd x 4 days (Patient not taking: No sig reported) 40 tablet 0   . torsemide (DEMADEX) 20 MG tablet Take 20 mg by mouth daily. (Patient not taking: Reported on 08/08/2020)   Not Taking at Unknown time  . traMADol (ULTRAM) 50 MG tablet Take 50 mg by mouth every 6 (six) hours as needed. (Patient not taking: Reported on 08/08/2020)   Not Taking at Unknown time   Scheduled: . calcitRIOL  0.25 mcg Oral Daily  . Chlorhexidine Gluconate Cloth  6 each Topical Q0600  . darbepoetin (ARANESP) injection - NON-DIALYSIS  100 mcg Subcutaneous Once  . enoxaparin (LOVENOX) injection  170 mg Subcutaneous Q24H  . gabapentin  100 mg Oral TID  . insulin aspart  0-5 Units Subcutaneous QHS  . insulin aspart  0-9 Units Subcutaneous TID WC  . insulin glargine  15 Units Subcutaneous QHS  . lipase/protease/amylase  12,000 Units Oral BID  . metoprolol succinate  50 mg Oral BID  . pantoprazole  40 mg Oral Daily   Continuous: . furosemide 120 mg (08/10/20 1020)   [id number] **OR** acetaminophen, albuterol, HYDROcodone-acetaminophen, metoprolol tartrate, ondansetron **OR** ondansetron (ZOFRAN) IV, polyethylene glycol  Allergies  Allergen Reactions  . Ace Inhibitors   . Lortab [Hydrocodone-Acetaminophen]      ROS:  A comprehensive review of systems was negative except for: Respiratory: positive for SOB Cardiovascular: positive for dyspnea, tachypnea and Swelling in her extremities  Blood pressure 115/71, pulse 76, temperature 98.4 F (36.9 C), temperature source Oral, resp. rate 20, height '5\' 8"'$  (1.727 m), weight (!) 169.3 kg, SpO2 100 %. Physical Exam Vitals reviewed.  Constitutional:      Appearance: She is obese.  HENT:     Head: Normocephalic.  Eyes:     Extraocular Movements: Extraocular movements intact.  Cardiovascular:     Rate and Rhythm: Normal  rate.  Pulmonary:     Effort: Pulmonary effort is normal.  Abdominal:     Palpations: Abdomen is soft.     Tenderness: There is no abdominal tenderness.  Musculoskeletal:     Right lower leg: Edema present.     Left lower leg: Edema present.     Comments: LUE fistula in place healing  Neurological:  General: No focal deficit present.     Mental Status: She is oriented to person, place, and time.  Psychiatric:        Mood and Affect: Mood normal.     Results: Results for orders placed or performed during the hospital encounter of 08/08/20 (from the past 48 hour(s))  CBC     Status: Abnormal   Collection Time: 08/08/20  1:05 PM  Result Value Ref Range   WBC 10.6 (H) 4.0 - 10.5 K/uL   RBC 3.36 (L) 3.87 - 5.11 MIL/uL   Hemoglobin 9.7 (L) 12.0 - 15.0 g/dL   HCT 32.2 (L) 36.0 - 46.0 %   MCV 95.8 80.0 - 100.0 fL   MCH 28.9 26.0 - 34.0 pg   MCHC 30.1 30.0 - 36.0 g/dL   RDW 13.5 11.5 - 15.5 %   Platelets 245 150 - 400 K/uL   nRBC 0.0 0.0 - 0.2 %    Comment: Performed at [facility], [address] [place], [place] [id number]  Basic metabolic panel     Status: Abnormal   Collection Time: 08/08/20  1:05 PM  Result Value Ref Range   Sodium 136 135 - 145 mmol/L   Potassium 4.0 3.5 - 5.1 mmol/L   Chloride 98 98 - 111 mmol/L   CO2 25 22 - 32 mmol/L   Glucose, Bld 131 (H) 70 - 99 mg/dL    Comment: Glucose reference range applies only to samples taken after fasting for at least 8 hours.   BUN 128 (H) 8 - 23 mg/dL    Comment: RESULTS CONFIRMED BY MANUAL DILUTION   Creatinine, Ser 4.40 (H) 0.44 - 1.00 mg/dL   Calcium 8.0 (L) 8.9 - 10.3 mg/dL   GFR, Estimated 10 (L) >60 mL/min    Comment: (NOTE) Calculated using the CKD-EPI Creatinine Equation (2021)    Anion gap 13 5 - 15    Comment: Performed at [facility], [address] [place], [place] [address]  Brain natriuretic peptide     Status: Abnormal   Collection Time: 08/08/20  1:06 PM  Result Value Ref Range   B Natriuretic  Peptide 1,127.0 (H) 0.0 - 100.0 pg/mL    Comment: Performed at [facility], [address] [place], [place] [address]  Resp Panel by RT-PCR (Flu A&B, Covid) Nasopharyngeal Swab     Status: None   Collection Time: 08/08/20  4:30 PM   Specimen: Nasopharyngeal Swab; Nasopharyngeal(NP) swabs in vial transport medium  Result Value Ref Range   SARS Coronavirus 2 by RT PCR NEGATIVE NEGATIVE    Comment: (NOTE) SARS-CoV-2 target nucleic acids are NOT DETECTED.  The SARS-CoV-2 RNA is generally detectable in upper respiratory specimens during the acute phase of infection. The lowest concentration of SARS-CoV-2 viral copies this assay can detect is 138 copies/mL. A negative result does not preclude SARS-Cov-2 infection and should not be used as the sole basis for treatment or other patient management decisions. A negative result may occur with  improper specimen collection/handling, submission of specimen other than nasopharyngeal swab, presence of viral mutation(s) within the areas targeted by this assay, and inadequate number of viral copies(<138 copies/mL). A negative result must be combined with clinical observations, patient history, and epidemiological information. The expected result is Negative.  Fact Sheet for Patients:  EntrepreneurPulse.com.au  Fact Sheet for Healthcare Providers:  IncredibleEmployment.be  This test is no t yet approved or cleared by the [place] FDA and  has been authorized for detection and/or diagnosis  of SARS-CoV-2 by FDA under an Emergency Use Authorization (EUA). This EUA will remain  in effect (meaning this test can be used) for the duration of the COVID-19 declaration under Section 564(b)(1) of the Act, 21 U.S.C.section 360bbb-3(b)(1), unless the authorization is terminated  or revoked sooner.       Influenza A by PCR NEGATIVE NEGATIVE   Influenza B by PCR NEGATIVE NEGATIVE    Comment: (NOTE) The Xpert Xpress  SARS-CoV-2/FLU/RSV plus assay is intended as an aid in the diagnosis of influenza from Nasopharyngeal swab specimens and should not be used as a sole basis for treatment. Nasal washings and aspirates are unacceptable for Xpert Xpress SARS-CoV-2/FLU/RSV testing.  Fact Sheet for Patients: EntrepreneurPulse.com.au  Fact Sheet for Healthcare Providers: IncredibleEmployment.be  This test is not yet approved or cleared by the [place] FDA and has been authorized for detection and/or diagnosis of SARS-CoV-2 by FDA under an Emergency Use Authorization (EUA). This EUA will remain in effect (meaning this test can be used) for the duration of the COVID-19 declaration under Section 564(b)(1) of the Act, 21 U.S.C. section 360bbb-3(b)(1), unless the authorization is terminated or revoked.  Performed at [facility], [address] [place], [place] [address]   CBG monitoring, ED     Status: None   Collection Time: 08/08/20  6:06 PM  Result Value Ref Range   Glucose-Capillary 87 70 - 99 mg/dL    Comment: Glucose reference range applies only to samples taken after fasting for at least 8 hours.  Glucose, capillary     Status: Abnormal   Collection Time: 08/08/20 10:04 PM  Result Value Ref Range   Glucose-Capillary 109 (H) 70 - 99 mg/dL    Comment: Glucose reference range applies only to samples taken after fasting for at least 8 hours.  Basic metabolic panel     Status: Abnormal   Collection Time: 08/09/20  5:00 AM  Result Value Ref Range   Sodium 140 135 - 145 mmol/L   Potassium 4.0 3.5 - 5.1 mmol/L   Chloride 102 98 - 111 mmol/L   CO2 25 22 - 32 mmol/L   Glucose, Bld 142 (H) 70 - 99 mg/dL    Comment: Glucose reference range applies only to samples taken after fasting for at least 8 hours.   BUN 120 (H) 8 - 23 mg/dL    Comment: RESULTS CONFIRMED BY MANUAL DILUTION   Creatinine, Ser 3.89 (H) 0.44 - 1.00 mg/dL   Calcium 8.2 (L) 8.9 - 10.3 mg/dL   GFR,  Estimated 12 (L) >60 mL/min    Comment: (NOTE) Calculated using the CKD-EPI Creatinine Equation (2021)    Anion gap 13 5 - 15    Comment: Performed at [facility], [address] [place],  [address]  Glucose, capillary     Status: Abnormal   Collection Time: 08/09/20  5:26 AM  Result Value Ref Range   Glucose-Capillary 147 (H) 70 - 99 mg/dL    Comment: Glucose reference range applies only to samples taken after fasting for at least 8 hours.  Glucose, capillary     Status: Abnormal   Collection Time: 08/09/20  7:27 AM  Result Value Ref Range   Glucose-Capillary 128 (H) 70 - 99 mg/dL    Comment: Glucose reference range applies only to samples taken after fasting for at least 8 hours.  Glucose, capillary     Status: Abnormal   Collection Time: 08/09/20 11:23 AM  Result Value Ref Range   Glucose-Capillary 194 (  H) 70 - 99 mg/dL    Comment: Glucose reference range applies only to samples taken after fasting for at least 8 hours.  Glucose, capillary     Status: Abnormal   Collection Time: 08/09/20  3:59 PM  Result Value Ref Range   Glucose-Capillary 198 (H) 70 - 99 mg/dL    Comment: Glucose reference range applies only to samples taken after fasting for at least 8 hours.   Comment 1 Notify RN    Comment 2 Document in Chart   Glucose, capillary     Status: Abnormal   Collection Time: 08/09/20  9:46 PM  Result Value Ref Range   Glucose-Capillary 267 (H) 70 - 99 mg/dL    Comment: Glucose reference range applies only to samples taken after fasting for at least 8 hours.   Comment 1 Notify RN    Comment 2 Document in Chart   Basic metabolic panel     Status: Abnormal   Collection Time: 08/10/20  5:14 AM  Result Value Ref Range   Sodium 137 135 - 145 mmol/L   Potassium 3.8 3.5 - 5.1 mmol/L   Chloride 99 98 - 111 mmol/L   CO2 26 22 - 32 mmol/L   Glucose, Bld 158 (H) 70 - 99 mg/dL    Comment: Glucose reference range applies only to samples taken after fasting for at least 8 hours.    BUN 120 (H) 8 - 23 mg/dL    Comment: RESULTS CONFIRMED BY MANUAL DILUTION   Creatinine, Ser 3.69 (H) 0.44 - 1.00 mg/dL   Calcium 7.9 (L) 8.9 - 10.3 mg/dL   GFR, Estimated 13 (L) >60 mL/min    Comment: (NOTE) Calculated using the CKD-EPI Creatinine Equation (2021)    Anion gap 12 5 - 15    Comment: Performed at [facility], [address] [place], [place] [address]  CBC     Status: Abnormal   Collection Time: 08/10/20  5:14 AM  Result Value Ref Range   WBC 7.7 4.0 - 10.5 K/uL   RBC 2.98 (L) 3.87 - 5.11 MIL/uL   Hemoglobin 8.8 (L) 12.0 - 15.0 g/dL   HCT 28.3 (L) 36.0 - 46.0 %   MCV 95.0 80.0 - 100.0 fL   MCH 29.5 26.0 - 34.0 pg   MCHC 31.1 30.0 - 36.0 g/dL   RDW 13.6 11.5 - 15.5 %   Platelets 243 150 - 400 K/uL   nRBC 0.0 0.0 - 0.2 %    Comment: Performed at [facility], [address], [place], [place] [address]  Glucose, capillary     Status: Abnormal   Collection Time: 08/10/20  7:52 AM  Result Value Ref Range   Glucose-Capillary 113 (H) 70 - 99 mg/dL    Comment: Glucose reference range applies only to samples taken after fasting for at least 8 hours.    CT Chest Wo Contrast  Result Date: 08/08/2020 CLINICAL DATA:  Abnormal x-ray EXAM: CT CHEST WITHOUT CONTRAST TECHNIQUE: Multidetector CT imaging of the chest was performed following the standard protocol without IV contrast. COMPARISON:  Chest x-ray today. FINDINGS: Cardiovascular: Cardiomegaly. Dense mitral valve annular calcifications. Scattered aortic calcifications. No aneurysm. Mediastinum/Nodes: No mediastinal, hilar, or axillary adenopathy. Trachea and esophagus are unremarkable. Lungs/Pleura: No confluent opacities or effusions. Mild vascular congestion. Upper Abdomen: Prior cholecystectomy. Enlarged left hepatic lobe nodular contours suggesting cirrhosis. Musculoskeletal: Chest wall soft tissues are unremarkable. No acute bony abnormality. IMPRESSION: Cardiomegaly, vascular congestion. No confluent airspace opacities or  effusions. Appearance  of the liver suggests cirrhosis. Recommend clinical correlation. Electronically Signed   By: Rolm Baptise M.D.   On: 08/08/2020 15:46   US Venous Img Lower Bilateral (DVT)  Result Date: 08/08/2020 CLINICAL DATA:  Shortness of breath. EXAM: BILATERAL LOWER EXTREMITY VENOUS DOPPLER ULTRASOUND TECHNIQUE: Gray-scale sonography with graded compression, as well as color Doppler and duplex ultrasound were performed to evaluate the lower extremity deep venous systems from the level of the common femoral vein and including the common femoral, femoral, profunda femoral, popliteal and calf veins including the posterior tibial, peroneal and gastrocnemius veins when visible. The superficial great saphenous vein was also interrogated. Spectral Doppler was utilized to evaluate flow at rest and with distal augmentation maneuvers in the common femoral, femoral and popliteal veins. COMPARISON:  None. FINDINGS: RIGHT LOWER EXTREMITY Common Femoral Vein: No evidence of thrombus. Normal compressibility, respiratory phasicity and response to augmentation. Saphenofemoral Junction: No evidence of thrombus. Normal compressibility and flow on color Doppler imaging. Profunda Femoral Vein: No evidence of thrombus. Normal compressibility and flow on color Doppler imaging. Femoral Vein: No evidence of thrombus, although the distal vein is not visualized. Visualized portions of the vein demonstrate compressibility, respiratory phasicity and response to augmentation. Popliteal Vein: No evidence of thrombus. Normal compressibility, respiratory phasicity and response to augmentation. Calf Veins: No evidence of thrombus in the visualized calf veins. The peroneal vein is not visualized. Normal compressibility and flow on color Doppler imaging of the visualized calf veins. Superficial Great Saphenous Vein: No evidence of thrombus. Normal compressibility. Venous Reflux:  None. LEFT LOWER EXTREMITY Common Femoral Vein: No  evidence of thrombus. Normal compressibility, respiratory phasicity and response to augmentation. Saphenofemoral Junction: No evidence of thrombus. Normal compressibility and flow on color Doppler imaging. Profunda Femoral Vein: No evidence of thrombus. Normal compressibility and flow on color Doppler imaging. Femoral Vein: No evidence of thrombus, although the distal vein is not visualized. Visualized portions of the vein demonstrate compressibility, respiratory phasicity and response to augmentation. Popliteal Vein: Not visualized. Calf Veins: No evidence of thrombus in the visualized calf veins. The peroneal vein is not visualized. Normal compressibility and flow on color Doppler imaging of the visualized calf veins. Superficial Great Saphenous Vein: No evidence of thrombus. Normal compressibility. Venous Reflux:  None. IMPRESSION: Significantly limited evaluation due to patient body habitus with multiple veins not visualized. Within this limitation, no evidence of DVT. Electronically Signed   By: Margaretha Sheffield MD   On: 08/08/2020 14:23   DG Chest Port 1 View  Result Date: 08/08/2020 CLINICAL DATA:  Shortness of breath. EXAM: PORTABLE CHEST 1 VIEW COMPARISON:  Aug 31, 2019. FINDINGS: Stable cardiomegaly. No pneumothorax is noted. Right lung is clear. Mild left basilar atelectasis or infiltrate is noted with possible associated effusion. Bony thorax is unremarkable. IMPRESSION: Left basilar opacity as described above. Electronically Signed   By: Marijo Conception M.D.   On: 08/08/2020 13:23   ECHOCARDIOGRAM COMPLETE  Result Date: 08/09/2020    ECHOCARDIOGRAM REPORT   Patient Name:   Julie [name] Date of Exam: 08/09/2020 Medical Rec #:  [id number]      Height:       68.0 in Accession #:    [id number]     Weight:       376.5 lb Date of Birth:  1950-06-25       BSA:          2.674 m Patient Age:    54 years       BP:  120/80 mmHg Patient Gender: F              HR:           83 bpm. Exam Location:   [facility] Procedure: 2D Echo Indications:    CHF-Acute Systolic [id number]  History:        Patient has no prior history of Echocardiogram examinations.                 CHF; Risk Factors:Non-Smoker and Diabetes. AV Fistula, Breast                 Cancer.  Sonographer:    Leavy Cella RDCS (AE) Referring Phys: [place]  1. Left ventricular ejection fraction, by estimation, is 40%. The left ventricle has moderately decreased function. The left ventricle demonstrates global hypokinesis. There is mild left ventricular hypertrophy. Left ventricular diastolic parameters are  indeterminate.  2. Right ventricular systolic function is normal. The right ventricular size is normal.  3. Left atrial size was mildly dilated.  4. Right atrial size was mildly dilated.  5. Poorly visualized MR by color Doppler, by CW spectrum waveform would suggest at least moderate MR. The mitral valve is abnormal. Mild to moderate mitral valve regurgitation. No evidence of mitral stenosis.  6. The tricuspid valve is abnormal. Tricuspid valve regurgitation is mild to moderate.  7. The aortic valve is tricuspid. There is mild calcification of the aortic valve. There is mild thickening of the aortic valve. Aortic valve regurgitation is not visualized. No aortic stenosis is present.  8. The inferior vena cava is dilated in size with >50% respiratory variability, suggesting right atrial pressure of 8 mmHg. FINDINGS  Left Ventricle: Left ventricular ejection fraction, by estimation, is 40%. The left ventricle has moderately decreased function. The left ventricle demonstrates global hypokinesis. The left ventricular internal cavity size was normal in size. There is mild left ventricular hypertrophy. Left ventricular diastolic parameters are indeterminate. Right Ventricle: The right ventricular size is normal. No increase in right ventricular wall thickness. Right ventricular systolic function is normal. Left Atrium: Left  atrial size was mildly dilated. Right Atrium: Right atrial size was mildly dilated. Pericardium: There is no evidence of pericardial effusion. Mitral Valve: Poorly visualized MR by color Doppler, by CW spectrum waveform would suggest at least moderate MR. The mitral valve is abnormal. There is mild thickening of the mitral valve leaflet(s). There is mild calcification of the mitral valve leaflet(s). Mild mitral annular calcification. Mild to moderate mitral valve regurgitation. No evidence of mitral valve stenosis. Tricuspid Valve: The tricuspid valve is abnormal. Tricuspid valve regurgitation is mild to moderate. No evidence of tricuspid stenosis. Aortic Valve: The aortic valve is tricuspid. There is mild calcification of the aortic valve. There is mild thickening of the aortic valve. There is mild aortic valve annular calcification. Aortic valve regurgitation is not visualized. No aortic stenosis  is present. Aortic valve mean gradient measures 4.5 mmHg. Aortic valve peak gradient measures 8.0 mmHg. Aortic valve area, by VTI measures 1.40 cm. Pulmonic Valve: The pulmonic valve was not well visualized. Pulmonic valve regurgitation is mild. No evidence of pulmonic stenosis. Aorta: The aortic root is normal in size and structure. Pulmonary Artery: Moderate pulmonary HTN, PASP is 42 mmHg. Venous: The inferior vena cava is dilated in size with greater than 50% respiratory variability, suggesting right atrial pressure of 8 mmHg. IAS/Shunts: No atrial level shunt detected by color flow Doppler.  LEFT VENTRICLE PLAX 2D LVIDd:  4.63 cm  Diastology LVIDs:         3.64 cm  LV e' medial:    5.57 cm/s LV PW:         1.20 cm  LV E/e' medial:  28.2 LV IVS:        1.23 cm  LV e' lateral:   6.32 cm/s LVOT diam:     1.90 cm  LV E/e' lateral: 24.8 LV SV:         47 LV SV Index:   18 LVOT Area:     2.84 cm  RIGHT VENTRICLE TAPSE (M-mode): 1.8 cm LEFT ATRIUM              Index       RIGHT ATRIUM           Index LA diam:         5.00 cm  1.87 cm/m  RA Area:     25.90 cm LA Vol (A2C):   103.0 ml 38.51 ml/m RA Volume:   84.80 ml  31.71 ml/m LA Vol (A4C):   62.9 ml  23.52 ml/m LA Biplane Vol: 81.6 ml  30.51 ml/m  AORTIC VALVE AV Area (Vmax):    1.67 cm AV Area (Vmean):   1.43 cm AV Area (VTI):     1.40 cm AV Vmax:           141.60 cm/s AV Vmean:          102.397 cm/s AV VTI:            0.335 m AV Peak Grad:      8.0 mmHg AV Mean Grad:      4.5 mmHg LVOT Vmax:         83.65 cm/s LVOT Vmean:        51.588 cm/s LVOT VTI:          0.165 m LVOT/AV VTI ratio: 0.49  AORTA Ao Root diam: 2.70 cm MITRAL VALVE                TRICUSPID VALVE MV Area (PHT): 4.63 cm     TR Peak grad:   34.1 mmHg MV Decel Time: 164 msec     TR Vmax:        292.00 cm/s MR Peak grad: 94.9 mmHg MR Mean grad: 60.0 mmHg     SHUNTS MR Vmax:      487.00 cm/s   Systemic VTI:  0.17 m MR Vmean:     362.0 cm/s    Systemic Diam: 1.90 cm MV E velocity: 157.00 cm/s MV A velocity: 61.60 cm/s MV E/A ratio:  2.55 Carlyle Dolly MD Electronically signed by Carlyle Dolly MD Signature Date/Time: 08/09/2020/12:13:56 PM    Final    DG Hip Unilat W or Wo Pelvis 2-3 Views Left  Result Date: 08/08/2020 CLINICAL DATA:  Left hip pain.  No known injury. EXAM: DG HIP (WITH OR WITHOUT PELVIS) 2-3V LEFT COMPARISON:  None. FINDINGS: There is no evidence of hip fracture or dislocation. There is no evidence of arthropathy or other focal bone abnormality. IMPRESSION: Negative exam. Electronically Signed   By: Inge Rise M.D.   On: 08/08/2020 14:40     Assessment & Plan:  AVERII PAVY is a 70 y.o. 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   Discussed the risk of bleeding, infection, pneumothorax, injury to the  vessels.    Plan for tunneled dialysis catheter Monday as she has received both Eliquis and therapeutic lovenox last night NPO midnight on Sunday Hold Eliquis now, hold  Lovenox on Sunday  Discussed with Dr. Royce Macadamia and Dr. Manuella Ghazi.  Orders in a computer and can be released Sunday for OR Monday.   All questions were answered to the satisfaction of the patient.    Virl Cagey 08/10/2020, 10:45 AM

## 2020-08-10 NOTE — Consult Note (Signed)
[facility] Consult  Reason for Consult: ERSD dialysis access  Referring Physician:  Dr. Royce Macadamia   Chief Complaint    Shortness of Breath      HPI: Julie [name] is a 70 y.o. 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 She has been short of breath and swollen over the past week. She is on Eliquis for A fib and received that yesterday in addition to Lovenox therapeutic dosing last night.  She is ready to start dialysis but given the blood thinners we will proceed on Monday with placement to reduce the risk.  She had a lower extremity doppler for leg swelling that was negative for DVT but was limited due to her body habitus.    Past Medical History:  Diagnosis Date  . CHF (congestive heart failure) ([place])   . CKD (chronic kidney disease)   . Diabetes mellitus without complication ([place])   . Hypertension   . Hypothyroidism   . Sleep apnea     Past Surgical History:  Procedure Laterality Date  . AV FISTULA PLACEMENT    . BREAST SURGERY Bilateral    mastectomy   . CHOLECYSTECTOMY    . HYSTEROSCOPY WITH D & C     x2  . VEIN SURGERY Left    vein transferred     Family History  Problem Relation Age of Onset  . Diabetes Mother   . Diabetes Father   . Diabetes Brother   . Diabetes Sister   . Diabetes Sister   . Thyroid disease Sister   . Diabetes Son   . Healthy Son   . Healthy Son   . Healthy Son   . Healthy Daughter     Social History   Tobacco Use  . Smoking status: Never Smoker  . Smokeless tobacco: Never Used  Vaping Use  . Vaping Use: Never used  Substance Use Topics  . Alcohol use: Not Currently  . Drug use: Never    Medications:  I have reviewed the patient's current medications. Prior to Admission:  Medications Prior to Admission  Medication Sig Dispense Refill Last Dose  . cholecalciferol (VITAMIN D3) 25 MCG (1000 UNIT) tablet Take 1,000 Units by mouth daily.   Past Month at Unknown time  . ELIQUIS 5  MG TABS tablet Take 1 tablet by mouth 2 (two) times daily.   08/07/2020 at 1700  . gabapentin (NEURONTIN) 100 MG capsule Take 100 mg by mouth 3 (three) times daily.   08/08/2020 at Unknown time  . hydrOXYzine (ATARAX/VISTARIL) 25 MG tablet Take 25 mg by mouth daily as needed.    Past Week at Unknown time  . insulin aspart (NOVOLOG) 100 UNIT/ML injection Inject 10 Units into the skin 3 (three) times daily before meals. Sliding scale   08/08/2020 at Unknown time  . insulin degludec (TRESIBA FLEXTOUCH) 100 UNIT/ML FlexTouch Pen Inject 22 Units into the skin at bedtime.   08/07/2020 at Unknown time  . lipase/protease/amylase (CREON) 12000 units CPEP capsule Take 10,440 Units by mouth 2 (two) times daily.   Past Week at Unknown time  . meclizine (ANTIVERT) 12.5 MG tablet Take 12.5 mg by mouth every 6 (six) hours as needed.   08/08/2020 at Unknown time  . metoprolol succinate (TOPROL-XL) 50 MG 24 hr tablet Take 50 mg by mouth 2 (two) times daily.   08/08/2020 at 1700  . OZEMPIC, 0.25 OR 0.5 MG/DOSE, 2 MG/1.5ML SOPN Inject 0.25 mg into the skin  2 (two) times a week.   08/08/2020 at Unknown time  . pantoprazole (PROTONIX) 40 MG tablet Take 40 mg by mouth daily.   08/08/2020 at Unknown time  . sodium bicarbonate 650 MG tablet Take 650 mg by mouth daily as needed.      . torsemide (DEMADEX) 100 MG tablet Take 150 mg by mouth daily.   08/07/2020 at Unknown time  . aspirin EC 81 MG tablet Take 81 mg by mouth daily. (Patient not taking: No sig reported)   Not Taking at Unknown time  . calcitRIOL (ROCALTROL) 0.25 MCG capsule Take 0.25 mcg by mouth daily. (Patient not taking: No sig reported)   Not Taking at Unknown time  . cyclobenzaprine (FLEXERIL) 5 MG tablet Take 5 mg by mouth 3 (three) times daily as needed for muscle spasms. (Patient not taking: No sig reported)   Not Taking at Unknown time  . metoprolol tartrate (LOPRESSOR) 25 MG tablet Take 25 mg by mouth 2 (two) times daily. (Patient not taking: Reported on  08/08/2020)   Not Taking  . predniSONE (DELTASONE) 5 MG tablet Take 4 tabs po qd x 4 days, 3  tabs po qd x 4 days, 2  tabs po qd x 4 days, 1  tab po qd x 4 days (Patient not taking: No sig reported) 40 tablet 0   . torsemide (DEMADEX) 20 MG tablet Take 20 mg by mouth daily. (Patient not taking: Reported on 08/08/2020)   Not Taking at Unknown time  . traMADol (ULTRAM) 50 MG tablet Take 50 mg by mouth every 6 (six) hours as needed. (Patient not taking: Reported on 08/08/2020)   Not Taking at Unknown time   Scheduled: . calcitRIOL  0.25 mcg Oral Daily  . Chlorhexidine Gluconate Cloth  6 each Topical Q0600  . darbepoetin (ARANESP) injection - NON-DIALYSIS  100 mcg Subcutaneous Once  . enoxaparin (LOVENOX) injection  170 mg Subcutaneous Q24H  . gabapentin  100 mg Oral TID  . insulin aspart  0-5 Units Subcutaneous QHS  . insulin aspart  0-9 Units Subcutaneous TID WC  . insulin glargine  15 Units Subcutaneous QHS  . lipase/protease/amylase  12,000 Units Oral BID  . metoprolol succinate  50 mg Oral BID  . pantoprazole  40 mg Oral Daily   Continuous: . furosemide 120 mg (08/10/20 1020)   [id number] **OR** acetaminophen, albuterol, HYDROcodone-acetaminophen, metoprolol tartrate, ondansetron **OR** ondansetron (ZOFRAN) IV, polyethylene glycol  Allergies  Allergen Reactions  . Ace Inhibitors   . Lortab [Hydrocodone-Acetaminophen]      ROS:  A comprehensive review of systems was negative except for: Respiratory: positive for SOB Cardiovascular: positive for dyspnea, tachypnea and Swelling in her extremities  Blood pressure 115/71, pulse 76, temperature 98.4 F (36.9 C), temperature source Oral, resp. rate 20, height '5\' 8"'$  (1.727 m), weight (!) 169.3 kg, SpO2 100 %. Physical Exam Vitals reviewed.  Constitutional:      Appearance: She is obese.  HENT:     Head: Normocephalic.  Eyes:     Extraocular Movements: Extraocular movements intact.  Cardiovascular:     Rate and Rhythm: Normal  rate.  Pulmonary:     Effort: Pulmonary effort is normal.  Abdominal:     Palpations: Abdomen is soft.     Tenderness: There is no abdominal tenderness.  Musculoskeletal:     Right lower leg: Edema present.     Left lower leg: Edema present.     Comments: LUE fistula in place healing  Neurological:  General: No focal deficit present.     Mental Status: She is oriented to person, place, and time.  Psychiatric:        Mood and Affect: Mood normal.     Results: Results for orders placed or performed during the hospital encounter of 08/08/20 (from the past 48 hour(s))  CBC     Status: Abnormal   Collection Time: 08/08/20  1:05 PM  Result Value Ref Range   WBC 10.6 (H) 4.0 - 10.5 K/uL   RBC 3.36 (L) 3.87 - 5.11 MIL/uL   Hemoglobin 9.7 (L) 12.0 - 15.0 g/dL   HCT 32.2 (L) 36.0 - 46.0 %   MCV 95.8 80.0 - 100.0 fL   MCH 28.9 26.0 - 34.0 pg   MCHC 30.1 30.0 - 36.0 g/dL   RDW 13.5 11.5 - 15.5 %   Platelets 245 150 - 400 K/uL   nRBC 0.0 0.0 - 0.2 %    Comment: Performed at [facility], [address] [place], [place] [id number]  Basic metabolic panel     Status: Abnormal   Collection Time: 08/08/20  1:05 PM  Result Value Ref Range   Sodium 136 135 - 145 mmol/L   Potassium 4.0 3.5 - 5.1 mmol/L   Chloride 98 98 - 111 mmol/L   CO2 25 22 - 32 mmol/L   Glucose, Bld 131 (H) 70 - 99 mg/dL    Comment: Glucose reference range applies only to samples taken after fasting for at least 8 hours.   BUN 128 (H) 8 - 23 mg/dL    Comment: RESULTS CONFIRMED BY MANUAL DILUTION   Creatinine, Ser 4.40 (H) 0.44 - 1.00 mg/dL   Calcium 8.0 (L) 8.9 - 10.3 mg/dL   GFR, Estimated 10 (L) >60 mL/min    Comment: (NOTE) Calculated using the CKD-EPI Creatinine Equation (2021)    Anion gap 13 5 - 15    Comment: Performed at [facility], [address], [place], [place] [address]  Brain natriuretic peptide     Status: Abnormal   Collection Time: 08/08/20  1:06 PM  Result Value Ref Range   B Natriuretic  Peptide 1,127.0 (H) 0.0 - 100.0 pg/mL    Comment: Performed at [facility], [address], [place], [place] [address]  Resp Panel by RT-PCR (Flu A&B, Covid) Nasopharyngeal Swab     Status: None   Collection Time: 08/08/20  4:30 PM   Specimen: Nasopharyngeal Swab; Nasopharyngeal(NP) swabs in vial transport medium  Result Value Ref Range   SARS Coronavirus 2 by RT PCR NEGATIVE NEGATIVE    Comment: (NOTE) SARS-CoV-2 target nucleic acids are NOT DETECTED.  The SARS-CoV-2 RNA is generally detectable in upper respiratory specimens during the acute phase of infection. The lowest concentration of SARS-CoV-2 viral copies this assay can detect is 138 copies/mL. A negative result does not preclude SARS-Cov-2 infection and should not be used as the sole basis for treatment or other patient management decisions. A negative result may occur with  improper specimen collection/handling, submission of specimen other than nasopharyngeal swab, presence of viral mutation(s) within the areas targeted by this assay, and inadequate number of viral copies(<138 copies/mL). A negative result must be combined with clinical observations, patient history, and epidemiological information. The expected result is Negative.  Fact Sheet for Patients:  EntrepreneurPulse.com.au  Fact Sheet for Healthcare Providers:  IncredibleEmployment.be  This test is no t yet approved or cleared by the [place] FDA and  has been authorized for detection and/or diagnosis  of SARS-CoV-2 by FDA under an Emergency Use Authorization (EUA). This EUA will remain  in effect (meaning this test can be used) for the duration of the COVID-19 declaration under Section 564(b)(1) of the Act, 21 U.S.C.section 360bbb-3(b)(1), unless the authorization is terminated  or revoked sooner.       Influenza A by PCR NEGATIVE NEGATIVE   Influenza B by PCR NEGATIVE NEGATIVE    Comment: (NOTE) The Xpert Xpress  SARS-CoV-2/FLU/RSV plus assay is intended as an aid in the diagnosis of influenza from Nasopharyngeal swab specimens and should not be used as a sole basis for treatment. Nasal washings and aspirates are unacceptable for Xpert Xpress SARS-CoV-2/FLU/RSV testing.  Fact Sheet for Patients: EntrepreneurPulse.com.au  Fact Sheet for Healthcare Providers: IncredibleEmployment.be  This test is not yet approved or cleared by the [place] FDA and has been authorized for detection and/or diagnosis of SARS-CoV-2 by FDA under an Emergency Use Authorization (EUA). This EUA will remain in effect (meaning this test can be used) for the duration of the COVID-19 declaration under Section 564(b)(1) of the Act, 21 U.S.C. section 360bbb-3(b)(1), unless the authorization is terminated or revoked.  Performed at [facility], [address], [place], [place] [address]   CBG monitoring, ED     Status: None   Collection Time: 08/08/20  6:06 PM  Result Value Ref Range   Glucose-Capillary 87 70 - 99 mg/dL    Comment: Glucose reference range applies only to samples taken after fasting for at least 8 hours.  Glucose, capillary     Status: Abnormal   Collection Time: 08/08/20 10:04 PM  Result Value Ref Range   Glucose-Capillary 109 (H) 70 - 99 mg/dL    Comment: Glucose reference range applies only to samples taken after fasting for at least 8 hours.  Basic metabolic panel     Status: Abnormal   Collection Time: 08/09/20  5:00 AM  Result Value Ref Range   Sodium 140 135 - 145 mmol/L   Potassium 4.0 3.5 - 5.1 mmol/L   Chloride 102 98 - 111 mmol/L   CO2 25 22 - 32 mmol/L   Glucose, Bld 142 (H) 70 - 99 mg/dL    Comment: Glucose reference range applies only to samples taken after fasting for at least 8 hours.   BUN 120 (H) 8 - 23 mg/dL    Comment: RESULTS CONFIRMED BY MANUAL DILUTION   Creatinine, Ser 3.89 (H) 0.44 - 1.00 mg/dL   Calcium 8.2 (L) 8.9 - 10.3 mg/dL   GFR,  Estimated 12 (L) >60 mL/min    Comment: (NOTE) Calculated using the CKD-EPI Creatinine Equation (2021)    Anion gap 13 5 - 15    Comment: Performed at [facility], [address], [place], [place] [address]  Glucose, capillary     Status: Abnormal   Collection Time: 08/09/20  5:26 AM  Result Value Ref Range   Glucose-Capillary 147 (H) 70 - 99 mg/dL    Comment: Glucose reference range applies only to samples taken after fasting for at least 8 hours.  Glucose, capillary     Status: Abnormal   Collection Time: 08/09/20  7:27 AM  Result Value Ref Range   Glucose-Capillary 128 (H) 70 - 99 mg/dL    Comment: Glucose reference range applies only to samples taken after fasting for at least 8 hours.  Glucose, capillary     Status: Abnormal   Collection Time: 08/09/20 11:23 AM  Result Value Ref Range   Glucose-Capillary 194 (  H) 70 - 99 mg/dL    Comment: Glucose reference range applies only to samples taken after fasting for at least 8 hours.  Glucose, capillary     Status: Abnormal   Collection Time: 08/09/20  3:59 PM  Result Value Ref Range   Glucose-Capillary 198 (H) 70 - 99 mg/dL    Comment: Glucose reference range applies only to samples taken after fasting for at least 8 hours.   Comment 1 Notify RN    Comment 2 Document in Chart   Glucose, capillary     Status: Abnormal   Collection Time: 08/09/20  9:46 PM  Result Value Ref Range   Glucose-Capillary 267 (H) 70 - 99 mg/dL    Comment: Glucose reference range applies only to samples taken after fasting for at least 8 hours.   Comment 1 Notify RN    Comment 2 Document in Chart   Basic metabolic panel     Status: Abnormal   Collection Time: 08/10/20  5:14 AM  Result Value Ref Range   Sodium 137 135 - 145 mmol/L   Potassium 3.8 3.5 - 5.1 mmol/L   Chloride 99 98 - 111 mmol/L   CO2 26 22 - 32 mmol/L   Glucose, Bld 158 (H) 70 - 99 mg/dL    Comment: Glucose reference range applies only to samples taken after fasting for at least 8 hours.    BUN 120 (H) 8 - 23 mg/dL    Comment: RESULTS CONFIRMED BY MANUAL DILUTION   Creatinine, Ser 3.69 (H) 0.44 - 1.00 mg/dL   Calcium 7.9 (L) 8.9 - 10.3 mg/dL   GFR, Estimated 13 (L) >60 mL/min    Comment: (NOTE) Calculated using the CKD-EPI Creatinine Equation (2021)    Anion gap 12 5 - 15    Comment: Performed at [facility], [address] [place], [place] [address]  CBC     Status: Abnormal   Collection Time: 08/10/20  5:14 AM  Result Value Ref Range   WBC 7.7 4.0 - 10.5 K/uL   RBC 2.98 (L) 3.87 - 5.11 MIL/uL   Hemoglobin 8.8 (L) 12.0 - 15.0 g/dL   HCT 28.3 (L) 36.0 - 46.0 %   MCV 95.0 80.0 - 100.0 fL   MCH 29.5 26.0 - 34.0 pg   MCHC 31.1 30.0 - 36.0 g/dL   RDW 13.6 11.5 - 15.5 %   Platelets 243 150 - 400 K/uL   nRBC 0.0 0.0 - 0.2 %    Comment: Performed at [facility], [address] [place], [place] [address]  Glucose, capillary     Status: Abnormal   Collection Time: 08/10/20  7:52 AM  Result Value Ref Range   Glucose-Capillary 113 (H) 70 - 99 mg/dL    Comment: Glucose reference range applies only to samples taken after fasting for at least 8 hours.    CT Chest Wo Contrast  Result Date: 08/08/2020 CLINICAL DATA:  Abnormal x-ray EXAM: CT CHEST WITHOUT CONTRAST TECHNIQUE: Multidetector CT imaging of the chest was performed following the standard protocol without IV contrast. COMPARISON:  Chest x-ray today. FINDINGS: Cardiovascular: Cardiomegaly. Dense mitral valve annular calcifications. Scattered aortic calcifications. No aneurysm. Mediastinum/Nodes: No mediastinal, hilar, or axillary adenopathy. Trachea and esophagus are unremarkable. Lungs/Pleura: No confluent opacities or effusions. Mild vascular congestion. Upper Abdomen: Prior cholecystectomy. Enlarged left hepatic lobe nodular contours suggesting cirrhosis. Musculoskeletal: Chest wall soft tissues are unremarkable. No acute bony abnormality. IMPRESSION: Cardiomegaly, vascular congestion. No confluent airspace opacities or  effusions. Appearance  of the liver suggests cirrhosis. Recommend clinical correlation. Electronically Signed   By: Rolm Baptise M.D.   On: 08/08/2020 15:46   US Venous Img Lower Bilateral (DVT)  Result Date: 08/08/2020 CLINICAL DATA:  Shortness of breath. EXAM: BILATERAL LOWER EXTREMITY VENOUS DOPPLER ULTRASOUND TECHNIQUE: Gray-scale sonography with graded compression, as well as color Doppler and duplex ultrasound were performed to evaluate the lower extremity deep venous systems from the level of the common femoral vein and including the common femoral, femoral, profunda femoral, popliteal and calf veins including the posterior tibial, peroneal and gastrocnemius veins when visible. The superficial great saphenous vein was also interrogated. Spectral Doppler was utilized to evaluate flow at rest and with distal augmentation maneuvers in the common femoral, femoral and popliteal veins. COMPARISON:  None. FINDINGS: RIGHT LOWER EXTREMITY Common Femoral Vein: No evidence of thrombus. Normal compressibility, respiratory phasicity and response to augmentation. Saphenofemoral Junction: No evidence of thrombus. Normal compressibility and flow on color Doppler imaging. Profunda Femoral Vein: No evidence of thrombus. Normal compressibility and flow on color Doppler imaging. Femoral Vein: No evidence of thrombus, although the distal vein is not visualized. Visualized portions of the vein demonstrate compressibility, respiratory phasicity and response to augmentation. Popliteal Vein: No evidence of thrombus. Normal compressibility, respiratory phasicity and response to augmentation. Calf Veins: No evidence of thrombus in the visualized calf veins. The peroneal vein is not visualized. Normal compressibility and flow on color Doppler imaging of the visualized calf veins. Superficial Great Saphenous Vein: No evidence of thrombus. Normal compressibility. Venous Reflux:  None. LEFT LOWER EXTREMITY Common Femoral Vein: No  evidence of thrombus. Normal compressibility, respiratory phasicity and response to augmentation. Saphenofemoral Junction: No evidence of thrombus. Normal compressibility and flow on color Doppler imaging. Profunda Femoral Vein: No evidence of thrombus. Normal compressibility and flow on color Doppler imaging. Femoral Vein: No evidence of thrombus, although the distal vein is not visualized. Visualized portions of the vein demonstrate compressibility, respiratory phasicity and response to augmentation. Popliteal Vein: Not visualized. Calf Veins: No evidence of thrombus in the visualized calf veins. The peroneal vein is not visualized. Normal compressibility and flow on color Doppler imaging of the visualized calf veins. Superficial Great Saphenous Vein: No evidence of thrombus. Normal compressibility. Venous Reflux:  None. IMPRESSION: Significantly limited evaluation due to patient body habitus with multiple veins not visualized. Within this limitation, no evidence of DVT. Electronically Signed   By: Margaretha Sheffield MD   On: 08/08/2020 14:23   DG Chest Port 1 View  Result Date: 08/08/2020 CLINICAL DATA:  Shortness of breath. EXAM: PORTABLE CHEST 1 VIEW COMPARISON:  Aug 31, 2019. FINDINGS: Stable cardiomegaly. No pneumothorax is noted. Right lung is clear. Mild left basilar atelectasis or infiltrate is noted with possible associated effusion. Bony thorax is unremarkable. IMPRESSION: Left basilar opacity as described above. Electronically Signed   By: Marijo Conception M.D.   On: 08/08/2020 13:23   ECHOCARDIOGRAM COMPLETE  Result Date: 08/09/2020    ECHOCARDIOGRAM REPORT   Patient Name:   Julie [name] Date of Exam: 08/09/2020 Medical Rec #:  [id number]      Height:       68.0 in Accession #:    [id number]     Weight:       376.5 lb Date of Birth:  May 31, 1950       BSA:          2.674 m Patient Age:    89 years       BP:  120/80 mmHg Patient Gender: F              HR:           83 bpm. Exam Location:   [facility] Procedure: 2D Echo Indications:    CHF-Acute Systolic [id number]  History:        Patient has no prior history of Echocardiogram examinations.                 CHF; Risk Factors:Non-Smoker and Diabetes. AV Fistula, Breast                 Cancer.  Sonographer:    Leavy Cella RDCS (AE) Referring Phys: [place]  1. Left ventricular ejection fraction, by estimation, is 40%. The left ventricle has moderately decreased function. The left ventricle demonstrates global hypokinesis. There is mild left ventricular hypertrophy. Left ventricular diastolic parameters are  indeterminate.  2. Right ventricular systolic function is normal. The right ventricular size is normal.  3. Left atrial size was mildly dilated.  4. Right atrial size was mildly dilated.  5. Poorly visualized MR by color Doppler, by CW spectrum waveform would suggest at least moderate MR. The mitral valve is abnormal. Mild to moderate mitral valve regurgitation. No evidence of mitral stenosis.  6. The tricuspid valve is abnormal. Tricuspid valve regurgitation is mild to moderate.  7. The aortic valve is tricuspid. There is mild calcification of the aortic valve. There is mild thickening of the aortic valve. Aortic valve regurgitation is not visualized. No aortic stenosis is present.  8. The inferior vena cava is dilated in size with >50% respiratory variability, suggesting right atrial pressure of 8 mmHg. FINDINGS  Left Ventricle: Left ventricular ejection fraction, by estimation, is 40%. The left ventricle has moderately decreased function. The left ventricle demonstrates global hypokinesis. The left ventricular internal cavity size was normal in size. There is mild left ventricular hypertrophy. Left ventricular diastolic parameters are indeterminate. Right Ventricle: The right ventricular size is normal. No increase in right ventricular wall thickness. Right ventricular systolic function is normal. Left Atrium: Left  atrial size was mildly dilated. Right Atrium: Right atrial size was mildly dilated. Pericardium: There is no evidence of pericardial effusion. Mitral Valve: Poorly visualized MR by color Doppler, by CW spectrum waveform would suggest at least moderate MR. The mitral valve is abnormal. There is mild thickening of the mitral valve leaflet(s). There is mild calcification of the mitral valve leaflet(s). Mild mitral annular calcification. Mild to moderate mitral valve regurgitation. No evidence of mitral valve stenosis. Tricuspid Valve: The tricuspid valve is abnormal. Tricuspid valve regurgitation is mild to moderate. No evidence of tricuspid stenosis. Aortic Valve: The aortic valve is tricuspid. There is mild calcification of the aortic valve. There is mild thickening of the aortic valve. There is mild aortic valve annular calcification. Aortic valve regurgitation is not visualized. No aortic stenosis  is present. Aortic valve mean gradient measures 4.5 mmHg. Aortic valve peak gradient measures 8.0 mmHg. Aortic valve area, by VTI measures 1.40 cm. Pulmonic Valve: The pulmonic valve was not well visualized. Pulmonic valve regurgitation is mild. No evidence of pulmonic stenosis. Aorta: The aortic root is normal in size and structure. Pulmonary Artery: Moderate pulmonary HTN, PASP is 42 mmHg. Venous: The inferior vena cava is dilated in size with greater than 50% respiratory variability, suggesting right atrial pressure of 8 mmHg. IAS/Shunts: No atrial level shunt detected by color flow Doppler.  LEFT VENTRICLE PLAX 2D LVIDd:  4.63 cm  Diastology LVIDs:         3.64 cm  LV e' medial:    5.57 cm/s LV PW:         1.20 cm  LV E/e' medial:  28.2 LV IVS:        1.23 cm  LV e' lateral:   6.32 cm/s LVOT diam:     1.90 cm  LV E/e' lateral: 24.8 LV SV:         47 LV SV Index:   18 LVOT Area:     2.84 cm  RIGHT VENTRICLE TAPSE (M-mode): 1.8 cm LEFT ATRIUM              Index       RIGHT ATRIUM           Index LA diam:         5.00 cm  1.87 cm/m  RA Area:     25.90 cm LA Vol (A2C):   103.0 ml 38.51 ml/m RA Volume:   84.80 ml  31.71 ml/m LA Vol (A4C):   62.9 ml  23.52 ml/m LA Biplane Vol: 81.6 ml  30.51 ml/m  AORTIC VALVE AV Area (Vmax):    1.67 cm AV Area (Vmean):   1.43 cm AV Area (VTI):     1.40 cm AV Vmax:           141.60 cm/s AV Vmean:          102.397 cm/s AV VTI:            0.335 m AV Peak Grad:      8.0 mmHg AV Mean Grad:      4.5 mmHg LVOT Vmax:         83.65 cm/s LVOT Vmean:        51.588 cm/s LVOT VTI:          0.165 m LVOT/AV VTI ratio: 0.49  AORTA Ao Root diam: 2.70 cm MITRAL VALVE                TRICUSPID VALVE MV Area (PHT): 4.63 cm     TR Peak grad:   34.1 mmHg MV Decel Time: 164 msec     TR Vmax:        292.00 cm/s MR Peak grad: 94.9 mmHg MR Mean grad: 60.0 mmHg     SHUNTS MR Vmax:      487.00 cm/s   Systemic VTI:  0.17 m MR Vmean:     362.0 cm/s    Systemic Diam: 1.90 cm MV E velocity: 157.00 cm/s MV A velocity: 61.60 cm/s MV E/A ratio:  2.55 Carlyle Dolly MD Electronically signed by Carlyle Dolly MD Signature Date/Time: 08/09/2020/12:13:56 PM    Final    DG Hip Unilat W or Wo Pelvis 2-3 Views Left  Result Date: 08/08/2020 CLINICAL DATA:  Left hip pain.  No known injury. EXAM: DG HIP (WITH OR WITHOUT PELVIS) 2-3V LEFT COMPARISON:  None. FINDINGS: There is no evidence of hip fracture or dislocation. There is no evidence of arthropathy or other focal bone abnormality. IMPRESSION: Negative exam. Electronically Signed   By: Inge Rise M.D.   On: 08/08/2020 14:40     Assessment & Plan:  OSA SCHRAG is a 70 y.o. 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   Discussed the risk of bleeding, infection, pneumothorax, injury to the  vessels.    Plan for tunneled dialysis catheter Monday as she has received both Eliquis and therapeutic lovenox last night NPO midnight on Sunday Hold Eliquis now, hold  Lovenox on Sunday  Discussed with Dr. Royce Macadamia and Dr. Manuella Ghazi.  Orders in a computer and can be released Sunday for OR Monday.   All questions were answered to the satisfaction of the patient.    Virl Cagey 08/10/2020, 10:45 AM

## 2020-08-10 NOTE — Progress Notes (Addendum)
[facility] Progress Note  Name: Julie [name] MRN: [id number] DOB: January 01, 1951  Chief Complaint:  Shortness of breath and leg swelling  Subjective:  She had 1.7 liters UOP over 4/21 with IV diuretics (lasix 120 mg IV twice a day).  She got a dose of eliquis yesterday AM and held after that.  She did get a dose of lovenox last night.  She's not able to get a tunneled catheter until Monday given the same.  She voiced understanding and agreement with this plan and confirms she wants to start HD.  She feels like her swelling is better.  Last outpatient epo was 4/4  Review of systems:  She reports shortness of breath with exertion which is better  She denies chest pain  States sometimes has nausea but none today; no vomiting Denies dizziness ---------- Background on consult:  Julie [name] is a 70 y.o. female with a history of CKD stage V, diabetes mellitus, hypertension, and sleep apnea who presented to her nephrologist, Dr. Theador Hawthorne, on 4/20.  She has a fistula in place which has not yet mature and was recently revised last week at [facility] - not ready to use yet.  She indicated at the clinic appointment that she was short of breath and very swollen - progressing over the past week.  She has been taking torsemide 150 mg daily at home.  She was directed to the ER from clinic and her nephrologist also discussed with the patient that she would likely need dialysis.  Nephrology is consulted for assistance with management of chronic kidney disease.  She has been swollen and short of breath with exertion.  It's hard to walk.  She has to go up 13 steps at home and is out of breath at top.  She's been taking ibuprofen 600 mg once a week at night - she tries to limit or avoid but deals with chronic pain.  She has had CKD since 2022 per nephrology charting and her CKD is felt secondary to diabetes.  BUN 120 on 4/21.  She got lasix 40 mg IV and lasix 20 mg IV on 4/20 here and in the interim she has  been transitioned here to lasix 120 mg IV every 12 hours as of this am.  She has had a purewick in.  She has just had 950 mL uop and 1 unmeasured void today.  We discussed risks/benefits/indications of dialysis and she agrees to start HD.  She had a lower extremity duplex which did not demonstrate a DVT but was limited due to habitus.   Intake/Output Summary (Last 24 hours) at 08/10/2020 0901 Last data filed at 08/10/2020 0700 Gross per 24 hour  Intake 480 ml  Output 1150 ml  Net -670 ml    Vitals:  Vitals:   08/09/20 1333 08/09/20 2102 08/09/20 2151 08/10/20 0612  BP: 118/70 117/62 (!) 150/91 115/71  Pulse: 78 77 86 76  Resp: '16 18 20 20  '$ Temp: 98.6 F (37 C) 98.3 F (36.8 C) (!) 97.4 F (36.3 C) 98.4 F (36.9 C)  TempSrc: Oral Oral Oral Oral  SpO2: 97% 100% 100% 100%  Weight:    (!) 169.3 kg  Height:         Physical Exam:  General adult female in chair in no acute distress HEENT normocephalic atraumatic extraocular movements intact sclera anicteric Neck supple trachea midline Lungs clear to auscultation bilaterally normal work of breathing at rest  Heart S1S2 no rubs or gallops appreciated Abdomen  soft nontender nondistended Extremities bilateral lower extremity 1-2+ edema  Psych normal mood and affect Neuro - she got up with PT and staff assistance - 2 persons Access LUE AVF healing with bruit and weak thrill   Medications reviewed   Labs:  BMP Latest Ref Rng & Units 08/10/2020 08/09/2020 08/08/2020  Glucose 70 - 99 mg/dL 158(H) 142(H) 131(H)  BUN 8 - 23 mg/dL 120(H) 120(H) 128(H)  Creatinine 0.44 - 1.00 mg/dL 3.69(H) 3.89(H) 4.40(H)  BUN/Creat Ratio 6 - 22 (calc) - - -  Sodium 135 - 145 mmol/L 137 140 136  Potassium 3.5 - 5.1 mmol/L 3.8 4.0 4.0  Chloride 98 - 111 mmol/L 99 102 98  CO2 22 - 32 mmol/L '26 25 25  '$ Calcium 8.9 - 10.3 mg/dL 7.9(L) 8.2(L) 8.0(L)     Assessment/Plan:   # CKD stage V with progression to ESRD - AVF is not ready for use  - Start HD on  4/25 after tunneled dialysis access obtained - Consulted surgery.  She is on schedule for a tunneled catheter on 4/25 afternoon with Dr. Constance Haw.  She will remain off of eliquis for now.  We are hoping to avoid a nontunneled groin catheter unless needed emergently and she is alert and diuresing on current regimen and has obese habitus.  I have made her NPO after 12:01 am on 4/25 - Continue lasix IV for now - Will need to set up outpatient dialysis unit and am reaching out to social worker  # Acute on chronic systolic CHF - optimize volume status with diuretics as able and we are transitioning to HD   # Right > left leg swelling - optimize volume status with diuretics as able and we are transitioning to HD  - appreciate duplex   # HTN  - controlled   # Afib - rate control per primary team  - note eliquis on hold for anticipated tunneled dialysis catheter for 4/25  # Secondary hyperpara - intact PTH of 545 on 07/23/20 - calcitriol 0.25 mcg daily listed as home med - continue calcitriol here and will be transitioned to outpatient administration with dialysis (discontinue on discharge)  # Anemia CKD   - No urgent need for PRBC's - She has been on epogen outpatient and has iron repleted outpatient per protocol.  - Will give aranesp 100 mcg on 4/22  Claudia Desanctis, MD 08/10/2020 9:48 AM  Nephrology will follow labs this weekend and see if needed.  Please contact us with any questions.  If azotemia worsens would reduce diuretics for now   Claudia Desanctis, MD 1:17 PM 08/10/2020

## 2020-08-10 NOTE — Progress Notes (Signed)
PROGRESS NOTE    MARAN SATO  [id number] DOB: [birth year] DOA: 08/08/2020 PCP: Rosita Fire, MD   Brief Narrative:   Sunita Faden Adamsis a 70 y.o.femalewith medical history significant forCKD 5, OSA, diabetes mellitus, hypothyroidism, systolic CHF, hypertension and obstructive sleep apnea. Patient presented to the ED with complaints of leg swelling extending up to her thighs and difficulty breathing. Patient saw her nephrologist Dr. Theador Hawthorne on the day of admission and was referred to the ED. she is currently diuresing with IV Lasix with plans for tunneled catheter placement and hemodialysis initiation 4/25.  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)   A-V fistula (HCC)   Acute on chronic systolic congestive heart failure decompensation -Repeat 2D echocardiogram with LVEF 40% which is similar to prior at approximately 45% on 10/21 -LV dysfunction again noted -IV Lasix increased to 120 mg twice daily -Continue to monitor strict I's and O's as well as daily weights -She has had approximately 1.6 L of urine output documented since admission  CKD 5 -Severe volume overload as noted above -Appreciate nephrology consultation to assist with diuresis/potential start of hemodialysis -Patient has left upper extremity fistula that was recently placed on 4/15 per patient -[place] of BLE negative for DVT  Atrial fibrillation -On Eliquis at home which has been held for tunneled catheter on 4/25 -Continue to hold dose Lovenox -Continue metoprolol -Telemetry monitoring and IV metoprolol as needed for episodes of RVR  Left hip pain -Pelvic x-ray with no acute abnormality -Likely sciatica versus bursitis -Continue oral  pain medications as needed -PT evaluation prior to DC -Typically ambulates with cane  Type 2 diabetes -Hemoglobin A1c 7.4% -Continue SSI -Holding home Ozempic  Chronic anemia -Currently at baseline -Likely anemia of chronic disease related to CKD -Aranesp on 4/22 per Nephrology  Hypertension-stable -Continue home metoprolol -Monitor closely on aggressive IV Lasix diuresis  Morbid obesity with OSA -Lifestyle changes outpatient -CPAP at bedtime   DVT prophylaxis: Full dose Lovenox, to be held starting 4/24 Code Status: Full Family Communication: None at bedside Disposition Plan:  Status is: Inpatient  Remains inpatient appropriate because:IV treatments appropriate due to intensity of illness or inability to take PO and Inpatient level of care appropriate due to severity of illness   Dispo: The patient is from: Home  Anticipated d/c is to: Home  Patient currently is not medically stable to d/c.              Difficult to place patient No  Consultants:   Nephrology  General Surgery  Procedures:   See below  Antimicrobials:   None  Subjective: Patient seen and evaluated today with no new acute complaints or concerns. No acute concerns or events noted overnight.  Objective: Vitals:   08/09/20 1333 08/09/20 2102 08/09/20 2151 08/10/20 0612  BP: 118/70 117/62 (!) 150/91 115/71  Pulse: 78 77 86 76  Resp: '16 18 20 20  '$ Temp: 98.6 F (37 C) 98.3 F (36.8 C) (!) 97.4 F (36.3 C) 98.4 F (36.9 C)  TempSrc: Oral Oral Oral Oral  SpO2: 97% 100% 100% 100%  Weight:    (!) 169.3 kg  Height:        Intake/Output Summary (Last 24 hours) at 08/10/2020  [place] filed at 08/10/2020 0700 Gross per 24 hour  Intake 480 ml  Output 1150 ml  Net -670 ml   Filed Weights   08/08/20 2257 08/09/20 0500 08/10/20 0612  Weight: (!) 170.1 kg (!) 170.8 kg (!) 169.3 kg    Examination:  General exam: Appears calm and comfortable,  morbidly obese Respiratory system: Clear to auscultation. Respiratory effort normal. Cardiovascular system: S1 & S2 heard, RRR.  Gastrointestinal system: Abdomen is soft Central nervous system: Alert and awake Extremities: Bilateral diffuse edema Skin: No significant lesions noted Psychiatry: Flat affect.    Data Reviewed: I have personally reviewed following labs and imaging studies  CBC: Recent Labs  Lab 08/08/20 1305 08/10/20 0514  WBC 10.6* 7.7  HGB 9.7* 8.8*  HCT 32.2* 28.3*  MCV 95.8 95.0  PLT 245 0000000   Basic Metabolic Panel: Recent Labs  Lab 08/08/20 1305 08/09/20 0500 08/10/20 0514  NA 136 140 137  K 4.0 4.0 3.8  CL 98 102 99  CO2 '25 25 26  '$ GLUCOSE 131* 142* 158*  BUN 128* 120* 120*  CREATININE 4.40* 3.89* 3.69*  CALCIUM 8.0* 8.2* 7.9*   GFR: Estimated Creatinine Clearance: 24.1 mL/min (A) (by C-G formula based on SCr of 3.69 mg/dL (H)). Liver Function Tests: No results for input(s): AST, ALT, ALKPHOS, BILITOT, PROT, ALBUMIN in the last 168 hours. No results for input(s): LIPASE, AMYLASE in the last 168 hours. No results for input(s): AMMONIA in the last 168 hours. Coagulation Profile: No results for input(s): INR, PROTIME in the last 168 hours. Cardiac Enzymes: No results for input(s): CKTOTAL, CKMB, CKMBINDEX, TROPONINI in the last 168 hours. BNP (last 3 results) No results for input(s): PROBNP in the last 8760 hours. HbA1C: No results for input(s): HGBA1C in the last 72 hours. CBG: Recent Labs  Lab 08/09/20 1123 08/09/20 1559 08/09/20 2146 08/10/20 0752 08/10/20 1117  GLUCAP 194* 198* 267* 113* 123*   Lipid Profile: No results for input(s): CHOL, HDL, LDLCALC, TRIG, CHOLHDL, LDLDIRECT in the last 72 hours. Thyroid Function Tests: No results for input(s): TSH, T4TOTAL, FREET4, T3FREE, THYROIDAB in the last 72 hours. Anemia Panel: No results for input(s): VITAMINB12, FOLATE, FERRITIN, TIBC, IRON, RETICCTPCT in the last 72 hours. Sepsis  Labs: No results for input(s): PROCALCITON, LATICACIDVEN in the last 168 hours.  Recent Results (from the past 240 hour(s))  Resp Panel by RT-PCR (Flu A&B, Covid) Nasopharyngeal Swab     Status: None   Collection Time: 08/08/20  4:30 PM   Specimen: Nasopharyngeal Swab; Nasopharyngeal(NP) swabs in vial transport medium  Result Value Ref Range Status   SARS Coronavirus 2 by RT PCR NEGATIVE NEGATIVE Final    Comment: (NOTE) SARS-CoV-2 target nucleic acids are NOT DETECTED.  The SARS-CoV-2 RNA is generally detectable in upper respiratory specimens during the acute phase of infection. The lowest concentration of SARS-CoV-2 viral copies this assay can detect is 138 copies/mL. A negative result does not preclude SARS-Cov-2 infection and should not be used as the sole basis for treatment or other patient management decisions. A negative result may occur with  improper specimen collection/handling, submission of specimen other than nasopharyngeal swab, presence of viral mutation(s) within the areas targeted by this assay, and inadequate number of viral copies(<138 copies/mL). A negative result must be combined with clinical observations, patient history, and epidemiological information. The expected result is Negative.  Fact Sheet for Patients:  EntrepreneurPulse.com.au  Fact Sheet for Healthcare Providers:  IncredibleEmployment.be  This test is no t  yet approved or cleared by the [place] and  has been authorized for detection and/or diagnosis of SARS-CoV-2 by FDA under an Emergency Use Authorization (EUA). This EUA will remain  in effect (meaning this test can be used) for the duration of the COVID-19 declaration under Section 564(b)(1) of the Act, 21 U.S.C.section 360bbb-3(b)(1), unless the authorization is terminated  or revoked sooner.       Influenza A by PCR NEGATIVE NEGATIVE Final   Influenza B by PCR NEGATIVE NEGATIVE Final     Comment: (NOTE) The Xpert Xpress SARS-CoV-2/FLU/RSV plus assay is intended as an aid in the diagnosis of influenza from Nasopharyngeal swab specimens and should not be used as a sole basis for treatment. Nasal washings and aspirates are unacceptable for Xpert Xpress SARS-CoV-2/FLU/RSV testing.  Fact Sheet for Patients: EntrepreneurPulse.com.au  Fact Sheet for Healthcare Providers: IncredibleEmployment.be  This test is not yet approved or cleared by the [place] FDA and has been authorized for detection and/or diagnosis of SARS-CoV-2 by FDA under an Emergency Use Authorization (EUA). This EUA will remain in effect (meaning this test can be used) for the duration of the COVID-19 declaration under Section 564(b)(1) of the Act, 21 U.S.C. section 360bbb-3(b)(1), unless the authorization is terminated or revoked.  Performed at [facility], [address] [place], [place] [address]          Radiology Studies: CT Chest Wo Contrast  Result Date: 08/08/2020 CLINICAL DATA:  Abnormal x-ray EXAM: CT CHEST WITHOUT CONTRAST TECHNIQUE: Multidetector CT imaging of the chest was performed following the standard protocol without IV contrast. COMPARISON:  Chest x-ray today. FINDINGS: Cardiovascular: Cardiomegaly. Dense mitral valve annular calcifications. Scattered aortic calcifications. No aneurysm. Mediastinum/Nodes: No mediastinal, hilar, or axillary adenopathy. Trachea and esophagus are unremarkable. Lungs/Pleura: No confluent opacities or effusions. Mild vascular congestion. Upper Abdomen: Prior cholecystectomy. Enlarged left hepatic lobe nodular contours suggesting cirrhosis. Musculoskeletal: Chest wall soft tissues are unremarkable. No acute bony abnormality. IMPRESSION: Cardiomegaly, vascular congestion. No confluent airspace opacities or effusions. Appearance of the liver suggests cirrhosis. Recommend clinical correlation. Electronically Signed   By:  Rolm Baptise M.D.   On: 08/08/2020 15:46   US Venous Img Lower Bilateral (DVT)  Result Date: 08/08/2020 CLINICAL DATA:  Shortness of breath. EXAM: BILATERAL LOWER EXTREMITY VENOUS DOPPLER ULTRASOUND TECHNIQUE: Gray-scale sonography with graded compression, as well as color Doppler and duplex ultrasound were performed to evaluate the lower extremity deep venous systems from the level of the common femoral vein and including the common femoral, femoral, profunda femoral, popliteal and calf veins including the posterior tibial, peroneal and gastrocnemius veins when visible. The superficial great saphenous vein was also interrogated. Spectral Doppler was utilized to evaluate flow at rest and with distal augmentation maneuvers in the common femoral, femoral and popliteal veins. COMPARISON:  None. FINDINGS: RIGHT LOWER EXTREMITY Common Femoral Vein: No evidence of thrombus. Normal compressibility, respiratory phasicity and response to augmentation. Saphenofemoral Junction: No evidence of thrombus. Normal compressibility and flow on color Doppler imaging. Profunda Femoral Vein: No evidence of thrombus. Normal compressibility and flow on color Doppler imaging. Femoral Vein: No evidence of thrombus, although the distal vein is not visualized. Visualized portions of the vein demonstrate compressibility, respiratory phasicity and response to augmentation. Popliteal Vein: No evidence of thrombus. Normal compressibility, respiratory phasicity and response to augmentation. Calf Veins: No evidence of thrombus in the visualized calf veins. The peroneal vein is not visualized. Normal compressibility and flow on color Doppler imaging of the visualized calf veins.  Superficial Great Saphenous Vein: No evidence of thrombus. Normal compressibility. Venous Reflux:  None. LEFT LOWER EXTREMITY Common Femoral Vein: No evidence of thrombus. Normal compressibility, respiratory phasicity and response to augmentation. Saphenofemoral  Junction: No evidence of thrombus. Normal compressibility and flow on color Doppler imaging. Profunda Femoral Vein: No evidence of thrombus. Normal compressibility and flow on color Doppler imaging. Femoral Vein: No evidence of thrombus, although the distal vein is not visualized. Visualized portions of the vein demonstrate compressibility, respiratory phasicity and response to augmentation. Popliteal Vein: Not visualized. Calf Veins: No evidence of thrombus in the visualized calf veins. The peroneal vein is not visualized. Normal compressibility and flow on color Doppler imaging of the visualized calf veins. Superficial Great Saphenous Vein: No evidence of thrombus. Normal compressibility. Venous Reflux:  None. IMPRESSION: Significantly limited evaluation due to patient body habitus with multiple veins not visualized. Within this limitation, no evidence of DVT. Electronically Signed   By: Margaretha Sheffield MD   On: 08/08/2020 14:23   DG Chest Port 1 View  Result Date: 08/08/2020 CLINICAL DATA:  Shortness of breath. EXAM: PORTABLE CHEST 1 VIEW COMPARISON:  Aug 31, 2019. FINDINGS: Stable cardiomegaly. No pneumothorax is noted. Right lung is clear. Mild left basilar atelectasis or infiltrate is noted with possible associated effusion. Bony thorax is unremarkable. IMPRESSION: Left basilar opacity as described above. Electronically Signed   By: Marijo Conception M.D.   On: 08/08/2020 13:23   ECHOCARDIOGRAM COMPLETE  Result Date: 08/09/2020    ECHOCARDIOGRAM REPORT   Patient Name:   BRITTINI HAYMER Platner Date of Exam: 08/09/2020 Medical Rec #:  [id number]      Height:       68.0 in Accession #:    [id number]     Weight:       376.5 lb Date of Birth:  [birth year]       BSA:          2.674 m Patient Age:    28 years       BP:           120/80 mmHg Patient Gender: F              HR:           83 bpm. Exam Location:  [facility] Procedure: 2D Echo Indications:    CHF-Acute Systolic [id number]  History:        Patient has no prior  history of Echocardiogram examinations.                 CHF; Risk Factors:Non-Smoker and Diabetes. AV Fistula, Breast                 Cancer.  Sonographer:    Leavy Cella RDCS (AE) Referring Phys: [place]  1. Left ventricular ejection fraction, by estimation, is 40%. The left ventricle has moderately decreased function. The left ventricle demonstrates global hypokinesis. There is mild left ventricular hypertrophy. Left ventricular diastolic parameters are  indeterminate.  2. Right ventricular systolic function is normal. The right ventricular size is normal.  3. Left atrial size was mildly dilated.  4. Right atrial size was mildly dilated.  5. Poorly visualized MR by color Doppler, by CW spectrum waveform would suggest at least moderate MR. The mitral valve is abnormal. Mild to moderate mitral valve regurgitation. No evidence of mitral stenosis.  6. The tricuspid valve is abnormal. Tricuspid valve regurgitation is mild to moderate.  7. The aortic valve is tricuspid.  There is mild calcification of the aortic valve. There is mild thickening of the aortic valve. Aortic valve regurgitation is not visualized. No aortic stenosis is present.  8. The inferior vena cava is dilated in size with >50% respiratory variability, suggesting right atrial pressure of 8 mmHg. FINDINGS  Left Ventricle: Left ventricular ejection fraction, by estimation, is 40%. The left ventricle has moderately decreased function. The left ventricle demonstrates global hypokinesis. The left ventricular internal cavity size was normal in size. There is mild left ventricular hypertrophy. Left ventricular diastolic parameters are indeterminate. Right Ventricle: The right ventricular size is normal. No increase in right ventricular wall thickness. Right ventricular systolic function is normal. Left Atrium: Left atrial size was mildly dilated. Right Atrium: Right atrial size was mildly dilated. Pericardium: There is no  evidence of pericardial effusion. Mitral Valve: Poorly visualized MR by color Doppler, by CW spectrum waveform would suggest at least moderate MR. The mitral valve is abnormal. There is mild thickening of the mitral valve leaflet(s). There is mild calcification of the mitral valve leaflet(s). Mild mitral annular calcification. Mild to moderate mitral valve regurgitation. No evidence of mitral valve stenosis. Tricuspid Valve: The tricuspid valve is abnormal. Tricuspid valve regurgitation is mild to moderate. No evidence of tricuspid stenosis. Aortic Valve: The aortic valve is tricuspid. There is mild calcification of the aortic valve. There is mild thickening of the aortic valve. There is mild aortic valve annular calcification. Aortic valve regurgitation is not visualized. No aortic stenosis  is present. Aortic valve mean gradient measures 4.5 mmHg. Aortic valve peak gradient measures 8.0 mmHg. Aortic valve area, by VTI measures 1.40 cm. Pulmonic Valve: The pulmonic valve was not well visualized. Pulmonic valve regurgitation is mild. No evidence of pulmonic stenosis. Aorta: The aortic root is normal in size and structure. Pulmonary Artery: Moderate pulmonary HTN, PASP is 42 mmHg. Venous: The inferior vena cava is dilated in size with greater than 50% respiratory variability, suggesting right atrial pressure of 8 mmHg. IAS/Shunts: No atrial level shunt detected by color flow Doppler.  LEFT VENTRICLE PLAX 2D LVIDd:         4.63 cm  Diastology LVIDs:         3.64 cm  LV e' medial:    5.57 cm/s LV PW:         1.20 cm  LV E/e' medial:  28.2 LV IVS:        1.23 cm  LV e' lateral:   6.32 cm/s LVOT diam:     1.90 cm  LV E/e' lateral: 24.8 LV SV:         47 LV SV Index:   18 LVOT Area:     2.84 cm  RIGHT VENTRICLE TAPSE (M-mode): 1.8 cm LEFT ATRIUM              Index       RIGHT ATRIUM           Index LA diam:        5.00 cm  1.87 cm/m  RA Area:     25.90 cm LA Vol (A2C):   103.0 ml 38.51 ml/m RA Volume:   84.80 ml   31.71 ml/m LA Vol (A4C):   62.9 ml  23.52 ml/m LA Biplane Vol: 81.6 ml  30.51 ml/m  AORTIC VALVE AV Area (Vmax):    1.67 cm AV Area (Vmean):   1.43 cm AV Area (VTI):     1.40 cm AV Vmax:  141.60 cm/s AV Vmean:          102.397 cm/s AV VTI:            0.335 m AV Peak Grad:      8.0 mmHg AV Mean Grad:      4.5 mmHg LVOT Vmax:         83.65 cm/s LVOT Vmean:        51.588 cm/s LVOT VTI:          0.165 m LVOT/AV VTI ratio: 0.49  AORTA Ao Root diam: 2.70 cm MITRAL VALVE                TRICUSPID VALVE MV Area (PHT): 4.63 cm     TR Peak grad:   34.1 mmHg MV Decel Time: 164 msec     TR Vmax:        292.00 cm/s MR Peak grad: 94.9 mmHg MR Mean grad: 60.0 mmHg     SHUNTS MR Vmax:      487.00 cm/s   Systemic VTI:  0.17 m MR Vmean:     362.0 cm/s    Systemic Diam: 1.90 cm MV E velocity: 157.00 cm/s MV A velocity: 61.60 cm/s MV E/A ratio:  2.55 Carlyle Dolly MD Electronically signed by Carlyle Dolly MD Signature Date/Time: 08/09/2020/12:13:56 PM    Final    DG Hip Unilat W or Wo Pelvis 2-3 Views Left  Result Date: 08/08/2020 CLINICAL DATA:  Left hip pain.  No known injury. EXAM: DG HIP (WITH OR WITHOUT PELVIS) 2-3V LEFT COMPARISON:  None. FINDINGS: There is no evidence of hip fracture or dislocation. There is no evidence of arthropathy or other focal bone abnormality. IMPRESSION: Negative exam. Electronically Signed   By: Inge Rise M.D.   On: 08/08/2020 14:40        Scheduled Meds: . calcitRIOL  0.25 mcg Oral Daily  . Chlorhexidine Gluconate Cloth  6 each Topical Q0600  . enoxaparin (LOVENOX) injection  170 mg Subcutaneous Q24H  . gabapentin  100 mg Oral TID  . insulin aspart  0-5 Units Subcutaneous QHS  . insulin aspart  0-9 Units Subcutaneous TID WC  . insulin glargine  15 Units Subcutaneous QHS  . lipase/protease/amylase  12,000 Units Oral BID  . metoprolol succinate  50 mg Oral BID  . pantoprazole  40 mg Oral Daily   Continuous Infusions: . furosemide 120 mg (08/10/20 1020)      LOS: 2 days    Time spent: 35 minutes    Markita Stcharles Darleen Crocker, DO [facility]  If 7PM-7AM, please contact night-coverage [email or web address] 08/10/2020, 11:34 AM

## 2020-08-10 NOTE — Progress Notes (Signed)
Assumed pt care at 0700. Pt resting comfortably in bed in no acute distress. A&O. Denies pain. VSS. Lungs diminished bilaterally. Bowel sounds active. LBM 08/09/20. Left upper arm surgical incision, approximated, blister adjacent to the left side of incision. Steri strips in place. +3 BLE edema. Pt awaiting dialysis after temporary catheter in place. Call bell within reach. Will continue to monitor.

## 2020-08-10 NOTE — TOC Progression Note (Signed)
Transition of Care [facility]) - Progression Note    Patient Details  Name: Julie [name] MRN: [id number] Date of Birth: January 30, 1951  [facility] CM/SW Contact  Jun Rightmyer, Chauncey Reading, RN Phone Number: 08/10/2020, 12:46 PM  Clinical Narrative:    Damaris Schooner with patient regarding choice for outpatient dialysis chair. She currently is a patient of Dr. Ulice Bold, MD at [facility]. However, patient lives in [place], [place] and would prefer to stay local so that she can use a transportation [place]. Discussed with staff at [facility] that patient will stay local in [place], [place].  Will start outpatient chair in [place], [place], with [facility].   Expected Discharge Plan: Home/Self Care Barriers to Discharge: Continued Medical Work up  Expected Discharge Plan and Services Expected Discharge Plan: Home/Self Care In-house Referral: Clinical Social Work Discharge Planning Services: CM Consult Post Acute Care Choice: NA Living arrangements for the past 2 months: Single Family Home                                       Social Determinants of Health (SDOH) Interventions    Readmission Risk Interventions Readmission Risk Prevention Plan 08/09/2020  Transportation Screening Complete  Home Care Screening Complete  Medication Review (RN CM) Complete  Some recent data might be hidden

## 2020-08-11 LAB — GLUCOSE, CAPILLARY
Glucose-Capillary: 117 mg/dL — ABNORMAL HIGH (ref 70–99)
Glucose-Capillary: 216 mg/dL — ABNORMAL HIGH (ref 70–99)
Glucose-Capillary: 202 mg/dL — ABNORMAL HIGH (ref 70–99)
Glucose-Capillary: 199 mg/dL — ABNORMAL HIGH (ref 70–99)

## 2020-08-11 LAB — BASIC METABOLIC PANEL
Anion gap: 10 (ref 5–15)
BUN: 118 mg/dL — ABNORMAL HIGH (ref 8–23)
CO2: 28 mmol/L (ref 22–32)
Calcium: 8.2 mg/dL — ABNORMAL LOW (ref 8.9–10.3)
Chloride: 100 mmol/L (ref 98–111)
Creatinine, Ser: 3.55 mg/dL — ABNORMAL HIGH (ref 0.44–1.00)
GFR, Estimated: 13 mL/min — ABNORMAL LOW (ref >60)
Glucose, Bld: 146 mg/dL — ABNORMAL HIGH (ref 70–99)
Potassium: 3.8 mmol/L (ref 3.5–5.1)
Sodium: 138 mmol/L (ref 135–145)

## 2020-08-11 LAB — HEMOGLOBIN A1C
Hgb A1c MFr Bld: 7.2 % — ABNORMAL HIGH (ref 4.8–5.6)
Mean Plasma Glucose: 159.94 mg/dL

## 2020-08-11 LAB — MAGNESIUM: Magnesium: 2.1 mg/dL (ref 1.7–2.4)

## 2020-08-11 MED ORDER — INSULIN ASPART 100 UNIT/ML ~~LOC~~ SOLN
0.0000 [IU] | Freq: Three times a day (TID) | SUBCUTANEOUS | Status: DC
  Administered 2020-08-11: 3 [IU] via SUBCUTANEOUS
  Administered 2020-08-12: 2 [IU] via SUBCUTANEOUS
  Administered 2020-08-12: 5 [IU] via SUBCUTANEOUS
  Administered 2020-08-12: 2 [IU] via SUBCUTANEOUS
  Administered 2020-08-13 (×2): 1 [IU] via SUBCUTANEOUS
  Administered 2020-08-14 (×2): 2 [IU] via SUBCUTANEOUS
  Administered 2020-08-14: 3 [IU] via SUBCUTANEOUS
  Administered 2020-08-15: 1 [IU] via SUBCUTANEOUS
  Administered 2020-08-15: 2 [IU] via SUBCUTANEOUS

## 2020-08-11 MED ORDER — INSULIN ASPART 100 UNIT/ML ~~LOC~~ SOLN
0.0000 [IU] | Freq: Every day | SUBCUTANEOUS | Status: DC
  Administered 2020-08-12: 3 [IU] via SUBCUTANEOUS
  Administered 2020-08-14: 2 [IU] via SUBCUTANEOUS

## 2020-08-11 MED ORDER — FUROSEMIDE 10 MG/ML IJ SOLN
INTRAMUSCULAR | Status: AC
  Filled 2020-08-11: qty 20

## 2020-08-11 NOTE — Progress Notes (Signed)
Patient refused CPAP.

## 2020-08-11 NOTE — Plan of Care (Signed)
?  Problem: Education: ?Goal: Ability to verbalize understanding of medication therapies will improve ?Outcome: Progressing ?  ?Problem: Activity: ?Goal: Capacity to carry out activities will improve ?Outcome: Progressing ?  ?Problem: Cardiac: ?Goal: Ability to achieve and maintain adequate cardiopulmonary perfusion will improve ?Outcome: Progressing ?  ?

## 2020-08-11 NOTE — Progress Notes (Signed)
Julie [name]   Subjective:   Julie [name] a 70 y.o.femalewith a history of CKD stage V, diabetes mellitus, hypertension, and sleep apnea who presented to her nephrologist, Dr. Joline Maxcy 4/20. She has a fistula in place which has not yet mature and was recently revisedlast week at [facility] - not ready to use yet.She indicated atthe clinic appointment that she was short of breath and very swollen - progressing over the past week.She has been taking torsemide 150 mg daily at home.She was directed to the ER from clinicand her nephrologist also discussed with the patient that she would likely need dialysis. Nephrology is consulted for assistance with management of chronic kidney disease. She has been swollen and short of breath with exertion. It's hard to walk. She has to go up 13 steps at home and is out of breath at top. She's been taking ibuprofen 600 mg once a week at night - she tries to limit or avoid but deals with chronic pain. She has had CKD since 2022 per nephrology charting and her CKD is felt secondary to diabetes.BUN 120 on 4/21.She got lasix 40 mg IV and lasix 20 mg IV on 4/20 here and in the interim she has been transitioned here to lasix 120 mg IV every 12 hoursas of this am.She has had a purewick in. . We discussed risks/benefits/indications of dialysis and she agrees to start HD. She had a lower extremity duplex which did not demonstrate a DVT but was limited due to habitus.  Blood pressure 112/62 pulse 82 temperature 98.6 O2 sats 100% room air  Sodium 138 potassium 3.8 chloride 100 CO2 28 BUN 118 creatinine 3.55 glucose 146 calcium 8.2 hemoglobin 8.8  Urine output 300 cc 08/10/2020   Objective:  Vital signs in last 24 hours:  Temp:  [97.6 F (36.4 C)-98.6 F (37 C)] 98.6 F (37 C) (04/23 0505) Pulse Rate:  [75-82] 82 (04/23 0505) Resp:  [18-20] 20 (04/23 0505) BP: (112-130)/(50-74) 112/62 (04/23 0505) SpO2:  [98 %-100 %]  100 % (04/23 0505) Weight:  [167.4 kg] 167.4 kg (04/23 0442)  Weight change: -1.9 kg Filed Weights   08/09/20 0500 08/10/20 0612 08/11/20 0442  Weight: (!) 170.8 kg (!) 169.3 kg (!) 167.4 kg    Intake/Output: I/O last 3 completed shifts: In: 960 [P.O.:840; IV Piggyback:120] Out: 700 [Urine:700]   Intake/Output this shift:  No intake/output data recorded.  General adult female in chair in no acute distress HEENT normocephalic atraumatic extraocular movements intact sclera anicteric Neck supple trachea midline Lungs clear to auscultation bilaterally normal work of breathing at rest  Heart S1S2 no rubs or gallops appreciated Abdomen soft nontender nondistended Extremities bilateral lower extremity 1-2+ edema  Psych normal mood and affect Neuro - she got up with PT and staff assistance - 2 persons Access LUE AVF healing with bruit and weak thrill    Basic Metabolic Panel: Recent Labs  Lab 08/08/20 1305 08/09/20 0500 08/10/20 0514 08/11/20 0446  NA 136 140 137 138  K 4.0 4.0 3.8 3.8  CL 98 102 99 100  CO2 '25 25 26 28  '$ GLUCOSE 131* 142* 158* 146*  BUN 128* 120* 120* 118*  CREATININE 4.40* 3.89* 3.69* 3.55*  CALCIUM 8.0* 8.2* 7.9* 8.2*  MG  --   --   --  2.1    Liver Function Tests: No results for input(s): AST, ALT, ALKPHOS, BILITOT, PROT, ALBUMIN in the last 168 hours. No results for input(s): LIPASE, AMYLASE in the last  168 hours. No results for input(s): AMMONIA in the last 168 hours.  CBC: Recent Labs  Lab 08/08/20 1305 08/10/20 0514  WBC 10.6* 7.7  HGB 9.7* 8.8*  HCT 32.2* 28.3*  MCV 95.8 95.0  PLT 245 243    Cardiac Enzymes: No results for input(s): CKTOTAL, CKMB, CKMBINDEX, TROPONINI in the last 168 hours.  BNP: Invalid input(s): POCBNP  CBG: Recent Labs  Lab 08/10/20 1117 08/10/20 1626 08/10/20 2201 08/11/20 0802 08/11/20 1109  GLUCAP 123* 140* 240* 117* 216*    Microbiology: Results for orders placed or performed during the hospital  encounter of 08/08/20  Resp Panel by RT-PCR (Flu A&B, Covid) Nasopharyngeal Swab     Status: None   Collection Time: 08/08/20  4:30 PM   Specimen: Nasopharyngeal Swab; Nasopharyngeal(NP) swabs in vial transport medium  Result Value Ref Range Status   SARS Coronavirus 2 by RT PCR NEGATIVE NEGATIVE Final    Comment: ([name]) SARS-CoV-2 target nucleic acids are NOT DETECTED.  The SARS-CoV-2 RNA is generally detectable in upper respiratory specimens during the acute phase of infection. The lowest concentration of SARS-CoV-2 viral copies this assay can detect is 138 copies/mL. A negative result does not preclude SARS-Cov-2 infection and should not be used as the sole basis for treatment or other patient management decisions. A negative result may occur with  improper specimen collection/handling, submission of specimen other than nasopharyngeal swab, presence of viral mutation(s) within the areas targeted by this assay, and inadequate number of viral copies(<138 copies/mL). A negative result must be combined with clinical observations, patient history, and epidemiological information. The expected result is Negative.  Fact Sheet for Patients:  EntrepreneurPulse.com.au  Fact Sheet for Healthcare Providers:  IncredibleEmployment.be  This test is no t yet approved or cleared by the [place] FDA and  has been authorized for detection and/or diagnosis of SARS-CoV-2 by FDA under an Emergency Use Authorization (EUA). This EUA will remain  in effect (meaning this test can be used) for the duration of the COVID-19 declaration under Section 564(b)(1) of the Act, 21 U.S.C.section 360bbb-3(b)(1), unless the authorization is terminated  or revoked sooner.       Influenza A by PCR NEGATIVE NEGATIVE Final   Influenza B by PCR NEGATIVE NEGATIVE Final    Comment: ([name]) The Xpert Xpress SARS-CoV-2/FLU/RSV plus assay is intended as an aid in the diagnosis of  influenza from Nasopharyngeal swab specimens and should not be used as a sole basis for treatment. Nasal washings and aspirates are unacceptable for Xpert Xpress SARS-CoV-2/FLU/RSV testing.  Fact Sheet for Patients: EntrepreneurPulse.com.au  Fact Sheet for Healthcare Providers: IncredibleEmployment.be  This test is not yet approved or cleared by the [place] FDA and has been authorized for detection and/or diagnosis of SARS-CoV-2 by FDA under an Emergency Use Authorization (EUA). This EUA will remain in effect (meaning this test can be used) for the duration of the COVID-19 declaration under Section 564(b)(1) of the Act, 21 U.S.C. section 360bbb-3(b)(1), unless the authorization is terminated or revoked.  Performed at [facility], [address] [place], [place] [address]     Coagulation Studies: No results for input(s): LABPROT, INR in the last 72 hours.  Urinalysis: No results for input(s): COLORURINE, LABSPEC, PHURINE, GLUCOSEU, HGBUR, BILIRUBINUR, KETONESUR, PROTEINUR, UROBILINOGEN, NITRITE, LEUKOCYTESUR in the last 72 hours.  Invalid input(s): APPERANCEUR    Imaging: ECHOCARDIOGRAM COMPLETE  Result Date: 08/09/2020    ECHOCARDIOGRAM REPORT   Patient Name:   Julie [name] Kasinger Date of Exam: 08/09/2020 Medical  Rec #:  [id number]      Height:       68.0 in Accession #:    [id number]     Weight:       376.5 lb Date of Birth:  01/11/1951       BSA:          2.674 m Patient Age:    29 years       BP:           120/80 mmHg Patient Gender: F              HR:           83 bpm. Exam Location:  [facility] Procedure: 2D Echo Indications:    CHF-Acute Systolic [id number]  History:        Patient has no prior history of Echocardiogram examinations.                 CHF; Risk Factors:Non-Smoker and Diabetes. AV Fistula, Breast                 Cancer.  Sonographer:    Leavy Cella RDCS (AE) Referring Phys: [place]  1. Left  ventricular ejection fraction, by estimation, is 40%. The left ventricle has moderately decreased function. The left ventricle demonstrates global hypokinesis. There is mild left ventricular hypertrophy. Left ventricular diastolic parameters are  indeterminate.  2. Right ventricular systolic function is normal. The right ventricular size is normal.  3. Left atrial size was mildly dilated.  4. Right atrial size was mildly dilated.  5. Poorly visualized MR by color Doppler, by CW spectrum waveform would suggest at least moderate MR. The mitral valve is abnormal. Mild to moderate mitral valve regurgitation. No evidence of mitral stenosis.  6. The tricuspid valve is abnormal. Tricuspid valve regurgitation is mild to moderate.  7. The aortic valve is tricuspid. There is mild calcification of the aortic valve. There is mild thickening of the aortic valve. Aortic valve regurgitation is not visualized. No aortic stenosis is present.  8. The inferior vena cava is dilated in size with >50% respiratory variability, suggesting right atrial pressure of 8 mmHg. FINDINGS  Left Ventricle: Left ventricular ejection fraction, by estimation, is 40%. The left ventricle has moderately decreased function. The left ventricle demonstrates global hypokinesis. The left ventricular internal cavity size was normal in size. There is mild left ventricular hypertrophy. Left ventricular diastolic parameters are indeterminate. Right Ventricle: The right ventricular size is normal. No increase in right ventricular wall thickness. Right ventricular systolic function is normal. Left Atrium: Left atrial size was mildly dilated. Right Atrium: Right atrial size was mildly dilated. Pericardium: There is no evidence of pericardial effusion. Mitral Valve: Poorly visualized MR by color Doppler, by CW spectrum waveform would suggest at least moderate MR. The mitral valve is abnormal. There is mild thickening of the mitral valve leaflet(s). There is mild  calcification of the mitral valve leaflet(s). Mild mitral annular calcification. Mild to moderate mitral valve regurgitation. No evidence of mitral valve stenosis. Tricuspid Valve: The tricuspid valve is abnormal. Tricuspid valve regurgitation is mild to moderate. No evidence of tricuspid stenosis. Aortic Valve: The aortic valve is tricuspid. There is mild calcification of the aortic valve. There is mild thickening of the aortic valve. There is mild aortic valve annular calcification. Aortic valve regurgitation is not visualized. No aortic stenosis  is present. Aortic valve mean gradient measures 4.5 mmHg. Aortic valve peak gradient measures 8.0 mmHg. Aortic  valve area, by VTI measures 1.40 cm. Pulmonic Valve: The pulmonic valve was not well visualized. Pulmonic valve regurgitation is mild. No evidence of pulmonic stenosis. Aorta: The aortic root is normal in size and structure. Pulmonary Artery: Moderate pulmonary HTN, PASP is 42 mmHg. Venous: The inferior vena cava is dilated in size with greater than 50% respiratory variability, suggesting right atrial pressure of 8 mmHg. IAS/Shunts: No atrial level shunt detected by color flow Doppler.  LEFT VENTRICLE PLAX 2D LVIDd:         4.63 cm  Diastology LVIDs:         3.64 cm  LV e' medial:    5.57 cm/s LV PW:         1.20 cm  LV E/e' medial:  28.2 LV IVS:        1.23 cm  LV e' lateral:   6.32 cm/s LVOT diam:     1.90 cm  LV E/e' lateral: 24.8 LV SV:         47 LV SV Index:   18 LVOT Area:     2.84 cm  RIGHT VENTRICLE TAPSE (M-mode): 1.8 cm LEFT ATRIUM              Index       RIGHT ATRIUM           Index LA diam:        5.00 cm  1.87 cm/m  RA Area:     25.90 cm LA Vol (A2C):   103.0 ml 38.51 ml/m RA Volume:   84.80 ml  31.71 ml/m LA Vol (A4C):   62.9 ml  23.52 ml/m LA Biplane Vol: 81.6 ml  30.51 ml/m  AORTIC VALVE AV Area (Vmax):    1.67 cm AV Area (Vmean):   1.43 cm AV Area (VTI):     1.40 cm AV Vmax:           141.60 cm/s AV Vmean:          102.397 cm/s AV  VTI:            0.335 m AV Peak Grad:      8.0 mmHg AV Mean Grad:      4.5 mmHg LVOT Vmax:         83.65 cm/s LVOT Vmean:        51.588 cm/s LVOT VTI:          0.165 m LVOT/AV VTI ratio: 0.49  AORTA Ao Root diam: 2.70 cm MITRAL VALVE                TRICUSPID VALVE MV Area (PHT): 4.63 cm     TR Peak grad:   34.1 mmHg MV Decel Time: 164 msec     TR Vmax:        292.00 cm/s MR Peak grad: 94.9 mmHg MR Mean grad: 60.0 mmHg     SHUNTS MR Vmax:      487.00 cm/s   Systemic VTI:  0.17 m MR Vmean:     362.0 cm/s    Systemic Diam: 1.90 cm MV E velocity: 157.00 cm/s MV A velocity: 61.60 cm/s MV E/A ratio:  2.55 Carlyle Dolly MD Electronically signed by Carlyle Dolly MD Signature Date/Time: 08/09/2020/12:13:56 PM    Final      Medications:   . furosemide 120 mg (08/11/20 1037)   . calcitRIOL  0.25 mcg Oral Daily  . Chlorhexidine Gluconate Cloth  6 each Topical Q0600  . enoxaparin (LOVENOX) injection  170 mg Subcutaneous Q24H  . gabapentin  100 mg Oral TID  . insulin aspart  0-5 Units Subcutaneous QHS  . insulin aspart  0-9 Units Subcutaneous TID WC  . insulin glargine  15 Units Subcutaneous QHS  . lipase/protease/amylase  12,000 Units Oral BID  . metoprolol succinate  50 mg Oral BID  . 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  She is on schedule for a tunneled catheter on 4/25 afternoon with Dr. Constance Haw.  She will remain off of eliquis for now.  We are hoping to avoid a nontunneled groin catheter unless needed emergently and she is alert and diuresing on current regimen and has obese habitus.  I have made her NPO after 12:01 am on 4/25 - Continue lasix IV for now - Will need to set up  outpatient dialysis unit and am reaching out to social worker  # Acute on chronic systolic CHF - optimize volume status with diuretics as able and we are transitioning to HD   # Right > left leg swelling - optimize volume status with diuretics as able and we are transitioning to HD  - appreciate duplex   #HTN - controlled   # Afib - rate control per primary team  - [name] eliquis on hold for anticipated tunneled dialysis catheter for 4/25  #Secondary hyperpara - intact PTH of 545 on 07/23/20 - calcitriol 0.25 mcg daily listed as home med - continue calcitriol here and will be transitioned to outpatient administration with dialysis (discontinue on discharge)  #Anemia CKD  - No urgent need for PRBC's - She has been on epogen outpatient and has iron repleted outpatient per protocol.  - Will give aranesp 100 mcg on 4/22  Claudia Desanctis, MD 08/10/2020 9:48 AM  Nephrology will follow labs this weekend and see if needed.  Please contact us with any questions.  If azotemia worsens would reduce    LOS: [place] '@TODAY''@11'$ :14 AM

## 2020-08-11 NOTE — Progress Notes (Signed)
PROGRESS NOTE    Julie [name]  [id number] DOB: 1950-09-14 DOA: 08/08/2020 PCP: Rosita Fire, MD   Brief Narrative:   Julie Cicchino Adamsis a 70 y.o.femalewith medical history significant forCKD 5, OSA, diabetes mellitus, hypothyroidism, systolic CHF, hypertension and obstructive sleep apnea. Patient presented to the ED with complaints of leg swelling extending up to her thighs and difficulty breathing. Patient saw her nephrologistDr. Alm Bustard the day of admission and was referred to the ED. she is currently diuresing with IV Lasix with plans for tunneled catheter placement and hemodialysis initiation 4/25.  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)   A-V fistula (HCC)   Acute on chronic systolic congestive heart failure decompensation -Repeat 2D echocardiogram with LVEF 40% which is similar to prior at approximately 45% on 10/21 -LV dysfunction again noted -IV Lasix increased to 120 mg twice daily -Continue to monitor strict I's and O's as well as daily weights -She has had approximately 2.1 L of urine output documented since admission  CKD 5 -Severe volume overload as noted above -Appreciate nephrology consultation to assist with diuresis/potential start of hemodialysis -Patient has left upper extremity fistula that was recently placed on 4/15 per patient -[place] of BLE negative for DVT  Atrial fibrillation -On Eliquis at home which has been held for tunneled catheter on 4/25 -Continue full dose Lovenox -Continue metoprolol -Telemetry monitoring and IV metoprolol as needed for episodes of RVR  Left hip pain -Pelvic x-ray with no acute abnormality -Likely sciatica versus bursitis -Continue oral pain  medications as needed -PT evaluation prior to DC -Typically ambulates with cane  Type 2 diabetes -Hemoglobin A1c 7.4% -Continue SSI -Holding home Ozempic  Chronic anemia -Currently at baseline -Likely anemia of chronic disease related to CKD -Aranesp on 4/22 per Nephrology  Hypertension-stable -Continue home metoprolol -Monitor closely on aggressive IV Lasix diuresis  Morbid obesity with OSA -Lifestyle changes outpatient -CPAP at bedtime   DVT prophylaxis:Full dose Lovenox, to be held starting 4/24 Code Status:Full Family Communication:None at bedside Disposition Plan: Status is: Inpatient  Remains inpatient appropriate because:IV treatments appropriate due to intensity of illness or inability to take PO and Inpatient level of care appropriate due to severity of illness   Dispo: The patient is from:Home Anticipated d/c is [id number] Patient currently is not medically stable to d/c. Difficult to place patient No  Consultants:  Nephrology  General Surgery  Procedures:  See below  Antimicrobials:  None  Subjective: Patient seen and evaluated today with no new acute complaints or concerns. No acute concerns or events noted overnight.  Objective: Vitals:   08/10/20 2103 08/10/20 2357 08/11/20 0442 08/11/20 0505  BP: (!) 130/50 (!) 117/57  112/62  Pulse: 81 82  82  Resp: '18 18  20  '$ Temp: 98 F (36.7 C)   98.6 F (37 C)  TempSrc: Oral   Oral  SpO2: 100% 98%  100%  Weight:   (!) 167.4 kg   Height:        Intake/Output Summary (Last 24 hours) at 08/11/2020 1150 Last data filed at 08/11/2020 0700 Gross per 24 hour  Intake 840 ml  Output --  Net 840 ml   Filed Weights   08/09/20 0500 08/10/20 0612 08/11/20  [id number]  Weight: (!) 170.8 kg (!) 169.3 kg (!) 167.4 kg    Examination:  General exam: Appears calm and comfortable, morbidly obese Respiratory system: Clear to auscultation. Respiratory  effort normal. Cardiovascular system: S1 & S2 heard, RRR.  Gastrointestinal system: Abdomen is soft Central nervous system: Alert and awake Extremities: bilateral LE edema Skin: No significant lesions noted Psychiatry: Flat affect.    Data Reviewed: I have personally reviewed following labs and imaging studies  CBC: Recent Labs  Lab 08/08/20 1305 08/10/20 0514  WBC 10.6* 7.7  HGB 9.7* 8.8*  HCT 32.2* 28.3*  MCV 95.8 95.0  PLT 245 0000000   Basic Metabolic Panel: Recent Labs  Lab 08/08/20 1305 08/09/20 0500 08/10/20 0514 08/11/20 0446  NA 136 140 137 138  K 4.0 4.0 3.8 3.8  CL 98 102 99 100  CO2 '25 25 26 28  '$ GLUCOSE 131* 142* 158* 146*  BUN 128* 120* 120* 118*  CREATININE 4.40* 3.89* 3.69* 3.55*  CALCIUM 8.0* 8.2* 7.9* 8.2*  MG  --   --   --  2.1   GFR: Estimated Creatinine Clearance: 24.9 mL/min (A) (by C-G formula based on SCr of 3.55 mg/dL (H)). Liver Function Tests: No results for input(s): AST, ALT, ALKPHOS, BILITOT, PROT, ALBUMIN in the last 168 hours. No results for input(s): LIPASE, AMYLASE in the last 168 hours. No results for input(s): AMMONIA in the last 168 hours. Coagulation Profile: No results for input(s): INR, PROTIME in the last 168 hours. Cardiac Enzymes: No results for input(s): CKTOTAL, CKMB, CKMBINDEX, TROPONINI in the last 168 hours. BNP (last 3 results) No results for input(s): PROBNP in the last 8760 hours. HbA1C: No results for input(s): HGBA1C in the last 72 hours. CBG: Recent Labs  Lab 08/10/20 1117 08/10/20 1626 08/10/20 2201 08/11/20 0802 08/11/20 1109  GLUCAP 123* 140* 240* 117* 216*   Lipid Profile: No results for input(s): CHOL, HDL, LDLCALC, TRIG, CHOLHDL, LDLDIRECT in the last 72 hours. Thyroid Function Tests: No results for input(s): TSH, T4TOTAL, FREET4, T3FREE, THYROIDAB in the last 72 hours. Anemia Panel: No results for input(s): VITAMINB12, FOLATE, FERRITIN, TIBC, IRON, RETICCTPCT in the last 72 hours. Sepsis  Labs: No results for input(s): PROCALCITON, LATICACIDVEN in the last 168 hours.  Recent Results (from the past 240 hour(s))  Resp Panel by RT-PCR (Flu A&B, Covid) Nasopharyngeal Swab     Status: None   Collection Time: 08/08/20  4:30 PM   Specimen: Nasopharyngeal Swab; Nasopharyngeal(NP) swabs in vial transport medium  Result Value Ref Range Status   SARS Coronavirus 2 by RT PCR NEGATIVE NEGATIVE Final    Comment: (NOTE) SARS-CoV-2 target nucleic acids are NOT DETECTED.  The SARS-CoV-2 RNA is generally detectable in upper respiratory specimens during the acute phase of infection. The lowest concentration of SARS-CoV-2 viral copies this assay can detect is 138 copies/mL. A negative result does not preclude SARS-Cov-2 infection and should not be used as the sole basis for treatment or other patient management decisions. A negative result may occur with  improper specimen collection/handling, submission of specimen other than nasopharyngeal swab, presence of viral mutation(s) within the areas targeted by this assay, and inadequate number of viral copies(<138 copies/mL). A negative result must be combined with clinical observations, patient history, and epidemiological information. The expected result is Negative.  Fact Sheet for Patients:  EntrepreneurPulse.com.au  Fact Sheet for Healthcare Providers:  IncredibleEmployment.be  This test is no t yet approved or cleared by the [place] FDA and  has  been authorized for detection and/or diagnosis of SARS-CoV-2 by FDA under an Emergency Use Authorization (EUA). This EUA will remain  in effect (meaning this test can be used) for the duration of the COVID-19 declaration under Section 564(b)(1) of the Act, 21 U.S.C.section 360bbb-3(b)(1), unless the authorization is terminated  or revoked sooner.       Influenza A by PCR NEGATIVE NEGATIVE Final   Influenza B by PCR NEGATIVE NEGATIVE Final     Comment: (NOTE) The Xpert Xpress SARS-CoV-2/FLU/RSV plus assay is intended as an aid in the diagnosis of influenza from Nasopharyngeal swab specimens and should not be used as a sole basis for treatment. Nasal washings and aspirates are unacceptable for Xpert Xpress SARS-CoV-2/FLU/RSV testing.  Fact Sheet for Patients: EntrepreneurPulse.com.au  Fact Sheet for Healthcare Providers: IncredibleEmployment.be  This test is not yet approved or cleared by the [place] FDA and has been authorized for detection and/or diagnosis of SARS-CoV-2 by FDA under an Emergency Use Authorization (EUA). This EUA will remain in effect (meaning this test can be used) for the duration of the COVID-19 declaration under Section 564(b)(1) of the Act, 21 U.S.C. section 360bbb-3(b)(1), unless the authorization is terminated or revoked.  Performed at [facility], [address], [place], [place] [address]          Radiology Studies: ECHOCARDIOGRAM COMPLETE  Result Date: 08/09/2020    ECHOCARDIOGRAM REPORT   Patient Name:   Julie [name] Date of Exam: 08/09/2020 Medical Rec #:  [id number]      Height:       68.0 in Accession #:    [id number]     Weight:       376.5 lb Date of Birth:  22-Sep-1950       BSA:          2.674 m Patient Age:    62 years       BP:           120/80 mmHg Patient Gender: F              HR:           83 bpm. Exam Location:  [facility] Procedure: 2D Echo Indications:    CHF-Acute Systolic [id number]  History:        Patient has no prior history of Echocardiogram examinations.                 CHF; Risk Factors:Non-Smoker and Diabetes. AV Fistula, Breast                 Cancer.  Sonographer:    Leavy Cella RDCS (AE) Referring Phys: [place]  1. Left ventricular ejection fraction, by estimation, is 40%. The left ventricle has moderately decreased function. The left ventricle demonstrates global hypokinesis. There is mild left  ventricular hypertrophy. Left ventricular diastolic parameters are  indeterminate.  2. Right ventricular systolic function is normal. The right ventricular size is normal.  3. Left atrial size was mildly dilated.  4. Right atrial size was mildly dilated.  5. Poorly visualized MR by color Doppler, by CW spectrum waveform would suggest at least moderate MR. The mitral valve is abnormal. Mild to moderate mitral valve regurgitation. No evidence of mitral stenosis.  6. The tricuspid valve is abnormal. Tricuspid valve regurgitation is mild to moderate.  7. The aortic valve is tricuspid. There is mild calcification of the aortic valve. There is mild thickening of the aortic valve. Aortic valve regurgitation is not  visualized. No aortic stenosis is present.  8. The inferior vena cava is dilated in size with >50% respiratory variability, suggesting right atrial pressure of 8 mmHg. FINDINGS  Left Ventricle: Left ventricular ejection fraction, by estimation, is 40%. The left ventricle has moderately decreased function. The left ventricle demonstrates global hypokinesis. The left ventricular internal cavity size was normal in size. There is mild left ventricular hypertrophy. Left ventricular diastolic parameters are indeterminate. Right Ventricle: The right ventricular size is normal. No increase in right ventricular wall thickness. Right ventricular systolic function is normal. Left Atrium: Left atrial size was mildly dilated. Right Atrium: Right atrial size was mildly dilated. Pericardium: There is no evidence of pericardial effusion. Mitral Valve: Poorly visualized MR by color Doppler, by CW spectrum waveform would suggest at least moderate MR. The mitral valve is abnormal. There is mild thickening of the mitral valve leaflet(s). There is mild calcification of the mitral valve leaflet(s). Mild mitral annular calcification. Mild to moderate mitral valve regurgitation. No evidence of mitral valve stenosis. Tricuspid Valve: The  tricuspid valve is abnormal. Tricuspid valve regurgitation is mild to moderate. No evidence of tricuspid stenosis. Aortic Valve: The aortic valve is tricuspid. There is mild calcification of the aortic valve. There is mild thickening of the aortic valve. There is mild aortic valve annular calcification. Aortic valve regurgitation is not visualized. No aortic stenosis  is present. Aortic valve mean gradient measures 4.5 mmHg. Aortic valve peak gradient measures 8.0 mmHg. Aortic valve area, by VTI measures 1.40 cm. Pulmonic Valve: The pulmonic valve was not well visualized. Pulmonic valve regurgitation is mild. No evidence of pulmonic stenosis. Aorta: The aortic root is normal in size and structure. Pulmonary Artery: Moderate pulmonary HTN, PASP is 42 mmHg. Venous: The inferior vena cava is dilated in size with greater than 50% respiratory variability, suggesting right atrial pressure of 8 mmHg. IAS/Shunts: No atrial level shunt detected by color flow Doppler.  LEFT VENTRICLE PLAX 2D LVIDd:         4.63 cm  Diastology LVIDs:         3.64 cm  LV e' medial:    5.57 cm/s LV PW:         1.20 cm  LV E/e' medial:  28.2 LV IVS:        1.23 cm  LV e' lateral:   6.32 cm/s LVOT diam:     1.90 cm  LV E/e' lateral: 24.8 LV SV:         47 LV SV Index:   18 LVOT Area:     2.84 cm  RIGHT VENTRICLE TAPSE (M-mode): 1.8 cm LEFT ATRIUM              Index       RIGHT ATRIUM           Index LA diam:        5.00 cm  1.87 cm/m  RA Area:     25.90 cm LA Vol (A2C):   103.0 ml 38.51 ml/m RA Volume:   84.80 ml  31.71 ml/m LA Vol (A4C):   62.9 ml  23.52 ml/m LA Biplane Vol: 81.6 ml  30.51 ml/m  AORTIC VALVE AV Area (Vmax):    1.67 cm AV Area (Vmean):   1.43 cm AV Area (VTI):     1.40 cm AV Vmax:           141.60 cm/s AV Vmean:          102.397 cm/s AV VTI:  0.335 m AV Peak Grad:      8.0 mmHg AV Mean Grad:      4.5 mmHg LVOT Vmax:         83.65 cm/s LVOT Vmean:        51.588 cm/s LVOT VTI:          0.165 m LVOT/AV VTI ratio:  0.49  AORTA Ao Root diam: 2.70 cm MITRAL VALVE                TRICUSPID VALVE MV Area (PHT): 4.63 cm     TR Peak grad:   34.1 mmHg MV Decel Time: 164 msec     TR Vmax:        292.00 cm/s MR Peak grad: 94.9 mmHg MR Mean grad: 60.0 mmHg     SHUNTS MR Vmax:      487.00 cm/s   Systemic VTI:  0.17 m MR Vmean:     362.0 cm/s    Systemic Diam: 1.90 cm MV E velocity: 157.00 cm/s MV A velocity: 61.60 cm/s MV E/A ratio:  2.55 Carlyle Dolly MD Electronically signed by Carlyle Dolly MD Signature Date/Time: 08/09/2020/12:13:56 PM    Final         Scheduled Meds: . calcitRIOL  0.25 mcg Oral Daily  . Chlorhexidine Gluconate Cloth  6 each Topical Q0600  . enoxaparin (LOVENOX) injection  170 mg Subcutaneous Q24H  . gabapentin  100 mg Oral TID  . insulin aspart  0-5 Units Subcutaneous QHS  . insulin aspart  0-9 Units Subcutaneous TID WC  . insulin glargine  15 Units Subcutaneous QHS  . lipase/protease/amylase  12,000 Units Oral BID  . metoprolol succinate  50 mg Oral BID  . pantoprazole  40 mg Oral Daily   Continuous Infusions: . furosemide 120 mg (08/11/20 1037)     LOS: 3 days    Time spent: 35 minutes    Gaspard Isbell Darleen Crocker, DO [facility]  If 7PM-7AM, please contact night-coverage [email or web address] 08/11/2020, 11:50 AM

## 2020-08-12 LAB — BASIC METABOLIC PANEL
Anion gap: 10 (ref 5–15)
BUN: 117 mg/dL — ABNORMAL HIGH (ref 8–23)
CO2: 27 mmol/L (ref 22–32)
Calcium: 8.4 mg/dL — ABNORMAL LOW (ref 8.9–10.3)
Chloride: 99 mmol/L (ref 98–111)
Creatinine, Ser: 3.47 mg/dL — ABNORMAL HIGH (ref 0.44–1.00)
GFR, Estimated: 14 mL/min — ABNORMAL LOW (ref >60)
Glucose, Bld: 207 mg/dL — ABNORMAL HIGH (ref 70–99)
Potassium: 3.7 mmol/L (ref 3.5–5.1)
Sodium: 136 mmol/L (ref 135–145)

## 2020-08-12 LAB — GLUCOSE, CAPILLARY
Glucose-Capillary: 176 mg/dL — ABNORMAL HIGH (ref 70–99)
Glucose-Capillary: 192 mg/dL — ABNORMAL HIGH (ref 70–99)
Glucose-Capillary: 279 mg/dL — ABNORMAL HIGH (ref 70–99)
Glucose-Capillary: 261 mg/dL — ABNORMAL HIGH (ref 70–99)

## 2020-08-12 LAB — MAGNESIUM: Magnesium: 2 mg/dL (ref 1.7–2.4)

## 2020-08-12 MED ORDER — SEMAGLUTIDE(0.25 OR 0.5MG/DOS) 2 MG/1.5ML ~~LOC~~ SOPN
0.2500 mg | PEN_INJECTOR | SUBCUTANEOUS | Status: DC
  Filled 2020-08-12 (×2): qty 0.19

## 2020-08-12 MED ORDER — CHLORHEXIDINE GLUCONATE CLOTH 2 % EX PADS
6.0000 | MEDICATED_PAD | Freq: Once | CUTANEOUS | Status: AC
  Administered 2020-08-12: 6 via TOPICAL

## 2020-08-12 MED ORDER — CHLORHEXIDINE GLUCONATE CLOTH 2 % EX PADS
6.0000 | MEDICATED_PAD | Freq: Once | CUTANEOUS | Status: AC
  Administered 2020-08-13: 6 via TOPICAL

## 2020-08-12 MED ORDER — CEFAZOLIN IN SODIUM CHLORIDE 3-0.9 GM/100ML-% IV SOLN
3.0000 g | INTRAVENOUS | Status: AC
  Filled 2020-08-12: qty 100

## 2020-08-12 NOTE — Progress Notes (Signed)
Orders on sign and hold for Permacath placement tomorrow.

## 2020-08-12 NOTE — Progress Notes (Signed)
PROGRESS NOTE    Julie [name]  [id number] DOB: 19-Oct-1950 DOA: 08/08/2020 PCP: Rosita Fire, MD   Brief Narrative:   Julie Satterlee Adamsis a 70 y.o.femalewith medical history significant forCKD 5, OSA, diabetes mellitus, hypothyroidism, systolic CHF, hypertension and obstructive sleep apnea. Patient presented to the ED with complaints of leg swelling extending up to her thighs and difficulty breathing. Patient saw her nephrologistDr. Alm Bustard the day of admission and was referred to the ED.she is currently diuresing with IV Lasix with plans for tunneled catheter placement and hemodialysis initiation 4/25.  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)   A-V fistula (HCC)   Acute on chronic systolic congestive heart failure decompensation -Repeat 2D echocardiogram with LVEF 40% which is similar to prior at approximately 45% on 10/21 -LV dysfunction again noted -IV Lasix increased to 120 mg twice daily -Continue to monitor strict I's and O's as well as daily weights -She has had approximately 2.1L of urine output documented since admission  CKD 5 -Severe volume overload as noted above -Appreciate plans for tunneled catheter placement on 4/25 and initiation of hemodialysis -Patient has left upper extremity fistula that was recently placed on 4/15 per patient -[place] of BLE negative for DVT  Atrial fibrillation -On Eliquis at home which has been heldfor tunneled catheter on 4/25 -Continue full dose Lovenox -Continue metoprolol -Telemetry monitoring and IV metoprolol as needed for episodes of RVR  Left hip pain -Pelvic x-ray with no acute abnormality -Likely sciatica versus bursitis -Continue oral pain  medications as needed -PT evaluation prior to DC -Typically ambulates with cane  Type 2 diabetes -Hemoglobin A1c 7.4% -Continue SSI -Holding home Ozempic  Chronic anemia -Currently at baseline -Likely anemia of chronic disease related to CKD -Aranesp on 4/22 per Nephrology  Hypertension-stable -Continue home metoprolol -Monitor closely on aggressive IV Lasix diuresis  Morbid obesity with OSA -Lifestyle changes outpatient -CPAP at bedtime   DVT prophylaxis:Full dose Lovenox, to be held starting 4/24 Code Status:Full Family Communication:None at bedside, pt will call family Disposition Plan: Status is: Inpatient  Remains inpatient appropriate because:IV treatments appropriate due to intensity of illness or inability to take PO and Inpatient level of care appropriate due to severity of illness   Dispo: The patient is from:Home Anticipated d/c is [id number] Patient currently is not medically stable to d/c. Difficult to place patient No  Consultants:  Nephrology  General Surgery  Procedures:  See below  Antimicrobials:  None  Subjective: Patient seen and evaluated today with no new acute complaints or concerns. No acute concerns or events noted overnight. She states she is not feeling quite like herself today, but cannot identify what that is.  Objective: Vitals:   08/11/20 1410 08/11/20 2034 08/12/20 0635 08/12/20 0700  BP: 122/68 132/83 120/60   Pulse: 80 76 79   Resp: '18 18 19   '$ Temp: 98.2 F (36.8 C) 98.2 F (36.8 C) 98.1 F (36.7 C)   TempSrc: Oral Oral Oral   SpO2: 100% 100% 99%   Weight:    (!) 168.9 kg  Height:        Intake/Output Summary (Last 24 hours) at 08/12/2020 1121 Last data filed at 08/12/2020 0900 Gross per 24 hour  Intake  600 ml  Output --  Net 600 ml   Filed Weights   08/10/20 0612 08/11/20 0442 08/12/20 0700  Weight: (!) 169.3 kg (!) 167.4 kg (!) 168.9 kg     Examination:  General exam: Appears calm and comfortable, morbidly obese Respiratory system: Clear to auscultation. Respiratory effort normal. Cardiovascular system: S1 & S2 heard, RRR.  Gastrointestinal system: Abdomen is soft Central nervous system: Alert and awake Extremities: Scant bilateral edema Skin: No significant lesions noted Psychiatry: Flat affect.    Data Reviewed: I have personally reviewed following labs and imaging studies  CBC: Recent Labs  Lab 08/08/20 1305 08/10/20 0514  WBC 10.6* 7.7  HGB 9.7* 8.8*  HCT 32.2* 28.3*  MCV 95.8 95.0  PLT 245 0000000   Basic Metabolic Panel: Recent Labs  Lab 08/08/20 1305 08/09/20 0500 08/10/20 0514 08/11/20 0446 08/12/20 0609  NA 136 140 137 138 136  K 4.0 4.0 3.8 3.8 3.7  CL 98 102 99 100 99  CO2 '25 25 26 28 27  '$ GLUCOSE 131* 142* 158* 146* 207*  BUN 128* 120* 120* 118* 117*  CREATININE 4.40* 3.89* 3.69* 3.55* 3.47*  CALCIUM 8.0* 8.2* 7.9* 8.2* 8.4*  MG  --   --   --  2.1 2.0   GFR: Estimated Creatinine Clearance: 25.6 mL/min (A) (by C-G formula based on SCr of 3.47 mg/dL (H)). Liver Function Tests: No results for input(s): AST, ALT, ALKPHOS, BILITOT, PROT, ALBUMIN in the last 168 hours. No results for input(s): LIPASE, AMYLASE in the last 168 hours. No results for input(s): AMMONIA in the last 168 hours. Coagulation Profile: No results for input(s): INR, PROTIME in the last 168 hours. Cardiac Enzymes: No results for input(s): CKTOTAL, CKMB, CKMBINDEX, TROPONINI in the last 168 hours. BNP (last 3 results) No results for input(s): PROBNP in the last 8760 hours. HbA1C: Recent Labs    08/11/20 1205  HGBA1C 7.2*   CBG: Recent Labs  Lab 08/11/20 0802 08/11/20 1109 08/11/20 1619 08/11/20 2031 08/12/20 0744  GLUCAP 117* 216* 202* 199* 176*   Lipid Profile: No results for input(s): CHOL, HDL, LDLCALC, TRIG, CHOLHDL, LDLDIRECT in the last 72 hours. Thyroid Function Tests: No results for input(s):  TSH, T4TOTAL, FREET4, T3FREE, THYROIDAB in the last 72 hours. Anemia Panel: No results for input(s): VITAMINB12, FOLATE, FERRITIN, TIBC, IRON, RETICCTPCT in the last 72 hours. Sepsis Labs: No results for input(s): PROCALCITON, LATICACIDVEN in the last 168 hours.  Recent Results (from the past 240 hour(s))  Resp Panel by RT-PCR (Flu A&B, Covid) Nasopharyngeal Swab     Status: None   Collection Time: 08/08/20  4:30 PM   Specimen: Nasopharyngeal Swab; Nasopharyngeal(NP) swabs in vial transport medium  Result Value Ref Range Status   SARS Coronavirus 2 by RT PCR NEGATIVE NEGATIVE Final    Comment: (NOTE) SARS-CoV-2 target nucleic acids are NOT DETECTED.  The SARS-CoV-2 RNA is generally detectable in upper respiratory specimens during the acute phase of infection. The lowest concentration of SARS-CoV-2 viral copies this assay can detect is 138 copies/mL. A negative result does not preclude SARS-Cov-2 infection and should not be used as the sole basis for treatment or other patient management decisions. A negative result may occur with  improper specimen collection/handling, submission of specimen other than nasopharyngeal swab, presence of viral mutation(s) within the areas targeted by this assay, and inadequate number of viral copies(<138 copies/mL). A negative result must be combined with clinical observations, patient history, and epidemiological information. The expected result is Negative.  Fact Sheet for Patients:  EntrepreneurPulse.com.au  Fact Sheet for Healthcare Providers:  IncredibleEmployment.be  This test is no t yet approved or cleared by the [place] FDA and  has been authorized for detection and/or diagnosis of SARS-CoV-2 by FDA under an Emergency Use Authorization (EUA). This EUA will remain  in effect (meaning this test can be used) for the duration of the COVID-19 declaration under Section 564(b)(1) of the Act,  21 U.S.C.section 360bbb-3(b)(1), unless the authorization is terminated  or revoked sooner.       Influenza A by PCR NEGATIVE NEGATIVE Final   Influenza B by PCR NEGATIVE NEGATIVE Final    Comment: (NOTE) The Xpert Xpress SARS-CoV-2/FLU/RSV plus assay is intended as an aid in the diagnosis of influenza from Nasopharyngeal swab specimens and should not be used as a sole basis for treatment. Nasal washings and aspirates are unacceptable for Xpert Xpress SARS-CoV-2/FLU/RSV testing.  Fact Sheet for Patients: EntrepreneurPulse.com.au  Fact Sheet for Healthcare Providers: IncredibleEmployment.be  This test is not yet approved or cleared by the [place] FDA and has been authorized for detection and/or diagnosis of SARS-CoV-2 by FDA under an Emergency Use Authorization (EUA). This EUA will remain in effect (meaning this test can be used) for the duration of the COVID-19 declaration under Section 564(b)(1) of the Act, 21 U.S.C. section 360bbb-3(b)(1), unless the authorization is terminated or revoked.  Performed at [facility], [address] [place], [place] [address]          Radiology Studies: No results found.      Scheduled Meds: . calcitRIOL  0.25 mcg Oral Daily  . Chlorhexidine Gluconate Cloth  6 each Topical Q0600  . Chlorhexidine Gluconate Cloth  6 each Topical Once   And  . Chlorhexidine Gluconate Cloth  6 each Topical Once  . gabapentin  100 mg Oral TID  . insulin aspart  0-5 Units Subcutaneous QHS  . insulin aspart  0-9 Units Subcutaneous TID WC  . insulin glargine  15 Units Subcutaneous QHS  . lipase/protease/amylase  12,000 Units Oral BID  . metoprolol succinate  50 mg Oral BID  . pantoprazole  40 mg Oral Daily   Continuous Infusions: .  ceFAZolin (ANCEF) IV    . furosemide Stopped (08/12/20 0929)     LOS: 4 days    Time spent: 35 minutes    Shafer Swamy D Manuella Ghazi, DO [facility]  If 7PM-7AM, please  contact night-coverage [email or web address] 08/12/2020, 11:21 AM

## 2020-08-12 NOTE — Progress Notes (Signed)
Patient refused CPAP doesn't like makes to much noise.

## 2020-08-12 NOTE — Progress Notes (Signed)
Vital signs: Blood pressure 120/60 pulse 79 temperature 98.1 O2 sats 99% room air.    Sodium 136 potassium 3.7 chloride 99 CO2 27 BUN 117 creatinine 3.47 glucose 207 calcium 8.4  Patient will be seen 08/13/2020

## 2020-08-13 ENCOUNTER — Inpatient Hospital Stay (HOSPITAL_COMMUNITY): Payer: Medicare Other | Admitting: Anesthesiology

## 2020-08-13 ENCOUNTER — Inpatient Hospital Stay (HOSPITAL_COMMUNITY): Payer: Medicare Other

## 2020-08-13 ENCOUNTER — Encounter (HOSPITAL_COMMUNITY): Payer: Self-pay | Admitting: Internal Medicine

## 2020-08-13 ENCOUNTER — Encounter (HOSPITAL_COMMUNITY)
Admission: EM | Disposition: A | Discharge status: Home Health | Payer: Self-pay | Source: Home / Self Care | Attending: Internal Medicine

## 2020-08-13 DIAGNOSIS — N186 End stage renal disease: Secondary | ICD-10-CM

## 2020-08-13 HISTORY — PX: INSERTION OF DIALYSIS CATHETER: SHX1324

## 2020-08-13 LAB — BASIC METABOLIC PANEL
Anion gap: 12 (ref 5–15)
BUN: 112 mg/dL — ABNORMAL HIGH (ref 8–23)
CO2: 27 mmol/L (ref 22–32)
Calcium: 8.8 mg/dL — ABNORMAL LOW (ref 8.9–10.3)
Chloride: 100 mmol/L (ref 98–111)
Creatinine, Ser: 3.25 mg/dL — ABNORMAL HIGH (ref 0.44–1.00)
GFR, Estimated: 15 mL/min — ABNORMAL LOW (ref >60)
Glucose, Bld: 162 mg/dL — ABNORMAL HIGH (ref 70–99)
Potassium: 3.8 mmol/L (ref 3.5–5.1)
Sodium: 139 mmol/L (ref 135–145)

## 2020-08-13 LAB — GLUCOSE, CAPILLARY
Glucose-Capillary: 143 mg/dL — ABNORMAL HIGH (ref 70–99)
Glucose-Capillary: 138 mg/dL — ABNORMAL HIGH (ref 70–99)
Glucose-Capillary: 122 mg/dL — ABNORMAL HIGH (ref 70–99)
Glucose-Capillary: 121 mg/dL — ABNORMAL HIGH (ref 70–99)
Glucose-Capillary: 192 mg/dL — ABNORMAL HIGH (ref 70–99)

## 2020-08-13 LAB — CBC
HCT: 29.8 % — ABNORMAL LOW (ref 36.0–46.0)
Hemoglobin: 9.1 g/dL — ABNORMAL LOW (ref 12.0–15.0)
MCH: 28.7 pg (ref 26.0–34.0)
MCHC: 30.5 g/dL (ref 30.0–36.0)
MCV: 94 fL (ref 80.0–100.0)
Platelets: 228 10*3/uL (ref 150–400)
RBC: 3.17 MIL/uL — ABNORMAL LOW (ref 3.87–5.11)
RDW: 13.3 % (ref 11.5–15.5)
WBC: 5.7 10*3/uL (ref 4.0–10.5)
nRBC: 0 % (ref 0.0–0.2)

## 2020-08-13 LAB — SURGICAL PCR SCREEN
MRSA, PCR: NEGATIVE
Staphylococcus aureus: POSITIVE — AB

## 2020-08-13 SURGERY — INSERTION OF DIALYSIS CATHETER
Anesthesia: General | Laterality: Right

## 2020-08-13 MED ORDER — LIDOCAINE HCL (CARDIAC) PF 100 MG/5ML IV SOSY
PREFILLED_SYRINGE | INTRAVENOUS | Status: DC | PRN
  Administered 2020-08-13: 100 mg via INTRAVENOUS

## 2020-08-13 MED ORDER — FENTANYL CITRATE (PF) 100 MCG/2ML IJ SOLN: 25.0000 ug | INTRAMUSCULAR | Status: DC | PRN

## 2020-08-13 MED ORDER — LIDOCAINE HCL (PF) 1 % IJ SOLN: 5.0000 mL | INTRAMUSCULAR | Status: DC | PRN

## 2020-08-13 MED ORDER — PROPOFOL 10 MG/ML IV BOLUS
INTRAVENOUS | Status: DC | PRN
  Administered 2020-08-13: 100 mg via INTRAVENOUS

## 2020-08-13 MED ORDER — CHLORHEXIDINE GLUCONATE CLOTH 2 % EX PADS
6.0000 | MEDICATED_PAD | Freq: Every day | CUTANEOUS | Status: DC
  Administered 2020-08-14 – 2020-08-15 (×2): 6 via TOPICAL

## 2020-08-13 MED ORDER — SODIUM CHLORIDE 0.9 % IV SOLN: 100.0000 mL | INTRAVENOUS | Status: DC | PRN

## 2020-08-13 MED ORDER — LIDOCAINE HCL (PF) 1 % IJ SOLN
INTRAMUSCULAR | Status: DC | PRN
  Administered 2020-08-13: 10 mL

## 2020-08-13 MED ORDER — EPHEDRINE SULFATE-NACL 50-0.9 MG/10ML-% IV SOSY
PREFILLED_SYRINGE | INTRAVENOUS | Status: DC | PRN
  Administered 2020-08-13: 10 mg via INTRAVENOUS

## 2020-08-13 MED ORDER — PROPOFOL 10 MG/ML IV BOLUS
INTRAVENOUS | Status: AC
  Filled 2020-08-13: qty 20

## 2020-08-13 MED ORDER — MUPIROCIN 2 % EX OINT
1.0000 "application " | TOPICAL_OINTMENT | Freq: Two times a day (BID) | CUTANEOUS | Status: DC
  Administered 2020-08-13 – 2020-08-15 (×5): 1 via NASAL
  Filled 2020-08-13 (×2): qty 22

## 2020-08-13 MED ORDER — CEFAZOLIN IN SODIUM CHLORIDE 3-0.9 GM/100ML-% IV SOLN
INTRAVENOUS | Status: AC
  Filled 2020-08-13: qty 100

## 2020-08-13 MED ORDER — HEPARIN SODIUM (PORCINE) 1000 UNIT/ML DIALYSIS
1000.0000 [IU] | INTRAMUSCULAR | Status: DC | PRN
  Filled 2020-08-13 (×2): qty 1

## 2020-08-13 MED ORDER — HEPARIN SODIUM (PORCINE) 1000 UNIT/ML IJ SOLN: 3800.0000 [IU] | Freq: Once | INTRAMUSCULAR | Status: DC

## 2020-08-13 MED ORDER — LIDOCAINE HCL (PF) 1 % IJ SOLN
INTRAMUSCULAR | Status: AC
  Filled 2020-08-13: qty 60

## 2020-08-13 MED ORDER — PHENYLEPHRINE 40 MCG/ML (10ML) SYRINGE FOR IV PUSH (FOR BLOOD PRESSURE SUPPORT)
PREFILLED_SYRINGE | INTRAVENOUS | Status: DC | PRN
  Administered 2020-08-13: 40 ug via INTRAVENOUS

## 2020-08-13 MED ORDER — HEPARIN SODIUM (PORCINE) 1000 UNIT/ML IJ SOLN
INTRAMUSCULAR | Status: AC
  Filled 2020-08-13: qty 4

## 2020-08-13 MED ORDER — FENTANYL CITRATE (PF) 250 MCG/5ML IJ SOLN
INTRAMUSCULAR | Status: DC | PRN
  Administered 2020-08-13 (×2): 50 ug via INTRAVENOUS

## 2020-08-13 MED ORDER — ANTICOAGULANT SODIUM CITRATE 4% (200MG/5ML) IV SOLN
5.0000 mL | Freq: Once | Status: DC
  Filled 2020-08-13: qty 5

## 2020-08-13 MED ORDER — LIDOCAINE-PRILOCAINE 2.5-2.5 % EX CREA: 1.0000 "application " | TOPICAL_CREAM | CUTANEOUS | Status: DC | PRN

## 2020-08-13 MED ORDER — CHLORHEXIDINE GLUCONATE 0.12 % MT SOLN: 15.0000 mL | Freq: Once | OROMUCOSAL | Status: DC

## 2020-08-13 MED ORDER — LACTATED RINGERS IV SOLN: INTRAVENOUS | Status: DC

## 2020-08-13 MED ORDER — SUCCINYLCHOLINE CHLORIDE 20 MG/ML IJ SOLN
INTRAMUSCULAR | Status: DC | PRN
  Administered 2020-08-13: 120 mg via INTRAVENOUS

## 2020-08-13 MED ORDER — SODIUM CHLORIDE (PF) 0.9 % IJ SOLN
INTRAMUSCULAR | Status: DC | PRN
  Administered 2020-08-13: 50 mL via INTRAVENOUS

## 2020-08-13 MED ORDER — FENTANYL CITRATE (PF) 250 MCG/5ML IJ SOLN
INTRAMUSCULAR | Status: AC
  Filled 2020-08-13: qty 5

## 2020-08-13 MED ORDER — ALTEPLASE 2 MG IJ SOLR: 2.0000 mg | Freq: Once | INTRAMUSCULAR | Status: DC | PRN

## 2020-08-13 MED ORDER — METOCLOPRAMIDE HCL 5 MG/ML IJ SOLN
INTRAMUSCULAR | Status: DC | PRN
  Administered 2020-08-13: 10 mg via INTRAVENOUS

## 2020-08-13 MED ORDER — HEPARIN 1000 UNIT/ML FOR PERITONEAL DIALYSIS
INTRAMUSCULAR | Status: DC | PRN
  Administered 2020-08-13: 4 mL via INTRAPERITONEAL

## 2020-08-13 MED ORDER — ORAL CARE MOUTH RINSE: 15.0000 mL | Freq: Once | OROMUCOSAL | Status: DC

## 2020-08-13 MED ORDER — PENTAFLUOROPROP-TETRAFLUOROETH EX AERO: 1.0000 "application " | INHALATION_SPRAY | CUTANEOUS | Status: DC | PRN

## 2020-08-13 MED ORDER — SODIUM CHLORIDE 0.9 % IV SOLN: INTRAVENOUS | Status: DC

## 2020-08-13 SURGICAL SUPPLY — 44 items
ADH SKN CLS APL DERMABOND .7 (GAUZE/BANDAGES/DRESSINGS) ×1
APL PRP STRL LF ISPRP CHG 10.5 (MISCELLANEOUS) ×1
APPLICATOR CHLORAPREP 10.5 ORG (MISCELLANEOUS) ×2 IMPLANT
BAG DECANTER FOR FLEXI CONT (MISCELLANEOUS) ×2 IMPLANT
BIOPATCH RED 1 DISK 7.0 (GAUZE/BANDAGES/DRESSINGS) ×2 IMPLANT
CATH PALINDROME-P 23CM W/VT (CATHETERS) ×1 IMPLANT
COVER LIGHT HANDLE STERIS (MISCELLANEOUS) ×4 IMPLANT
COVER PROBE U/S 5X48 (MISCELLANEOUS) ×2 IMPLANT
COVER WAND RF STERILE (DRAPES) ×2 IMPLANT
DECANTER SPIKE VIAL GLASS SM (MISCELLANEOUS) ×4 IMPLANT
DERMABOND ADVANCED (GAUZE/BANDAGES/DRESSINGS) ×1
DERMABOND ADVANCED .7 DNX12 (GAUZE/BANDAGES/DRESSINGS) ×1 IMPLANT
DRAPE C-ARM FOLDED MOBILE STRL (DRAPES) ×3 IMPLANT
DRAPE CHEST BREAST 15X10 FENES (DRAPES) ×2 IMPLANT
DRSG SORBAVIEW 3.5X5-5/16 MED (GAUZE/BANDAGES/DRESSINGS) ×2 IMPLANT
DRSG TEGADERM 2.38X2.75 (GAUZE/BANDAGES/DRESSINGS) ×1 IMPLANT
ELECT REM PT RETURN 9FT ADLT (ELECTROSURGICAL) ×2
ELECTRODE REM PT RTRN 9FT ADLT (ELECTROSURGICAL) ×1 IMPLANT
GAUZE 4X4 16PLY RFD (DISPOSABLE) ×2 IMPLANT
GEL ULTRASOUND 20GR AQUASONIC (MISCELLANEOUS) ×2 IMPLANT
GLOVE SURG ENC MOIS LTX SZ6.5 (GLOVE) ×2 IMPLANT
GLOVE SURG UNDER POLY LF SZ6.5 (GLOVE) ×2 IMPLANT
GLOVE SURG UNDER POLY LF SZ7 (GLOVE) ×4 IMPLANT
GOWN STRL REUS W/TWL LRG LVL3 (GOWN DISPOSABLE) ×4 IMPLANT
IV NS 500ML (IV SOLUTION) ×2
IV NS 500ML BAXH (IV SOLUTION) ×1 IMPLANT
KIT BLADEGUARD II DBL (SET/KITS/TRAYS/PACK) ×2 IMPLANT
KIT TURNOVER KIT A (KITS) ×2 IMPLANT
MARKER SKIN DUAL TIP RULER LAB (MISCELLANEOUS) ×2 IMPLANT
NDL HYPO 18GX1.5 BLUNT FILL (NEEDLE) ×1 IMPLANT
NDL HYPO 25X1 1.5 SAFETY (NEEDLE) ×1 IMPLANT
NEEDLE HYPO 18GX1.5 BLUNT FILL (NEEDLE) ×2 IMPLANT
NEEDLE HYPO 25X1 1.5 SAFETY (NEEDLE) ×2 IMPLANT
PACK BASIC III (CUSTOM PROCEDURE TRAY) ×2
PACK SRG BSC III STRL LF ECLPS (CUSTOM PROCEDURE TRAY) ×1 IMPLANT
PAD ARMBOARD 7.5X6 YLW CONV (MISCELLANEOUS) ×2 IMPLANT
PENCIL SMOKE EVACUATOR COATED (MISCELLANEOUS) ×2 IMPLANT
SET BASIN LINEN APH (SET/KITS/TRAYS/PACK) ×2 IMPLANT
SUT MNCRL AB 4-0 PS2 18 (SUTURE) ×2 IMPLANT
SUT SILK 2 0 FSL 18 (SUTURE) ×2 IMPLANT
SUT VIC AB 3-0 SH 27 (SUTURE) ×2
SUT VIC AB 3-0 SH 27X BRD (SUTURE) ×1 IMPLANT
SYR 10ML LL (SYRINGE) ×4 IMPLANT
SYR CONTROL 10ML LL (SYRINGE) ×2 IMPLANT

## 2020-08-13 NOTE — Progress Notes (Signed)
Asked patient about wearing CPAP tonight and patient refused.  No distress noted at this time.

## 2020-08-13 NOTE — Transfer of Care (Signed)
Immediate Anesthesia Transfer of Care Note  Patient: Julie [name]  Procedure(s) Performed: INSERTION OF DIALYSIS CATHETER (Right )  Patient Location: PACU  Anesthesia Type:General  Level of Consciousness: awake  Airway & Oxygen Therapy: Patient Spontanous Breathing and Patient connected to face mask oxygen  Post-op Assessment: Report given to RN and Post -op Vital signs reviewed and stable  Post vital signs: Reviewed and stable  Last Vitals:  Vitals Value Taken Time  BP 154/78 08/13/20 1245  Temp    Pulse 73 08/13/20 1248  Resp 14 08/13/20 1248  SpO2 95 % 08/13/20 1248  Vitals shown include unvalidated device data.  Last Pain:  Vitals:   08/13/20 0853  TempSrc: Oral  PainSc: 0-No pain      Patients Stated Pain Goal: 7 ([id number])  Complications: No complications documented.

## 2020-08-13 NOTE — Progress Notes (Signed)
Alert and oriented x 4. Calm and pleasant. Call light within reach. BSC nearby. C/o left hip pain, medication given with some relief. Npo after midnight for pending procedure. Son visited and daughter called and were updated.

## 2020-08-13 NOTE — Interval H&P Note (Signed)
History and Physical Interval Note:  08/13/2020 10:21 AM  Julie [name]  has presented today for surgery, with the diagnosis of esrd.  The various methods of treatment have been discussed with the patient and family. After consideration of risks, benefits and other options for treatment, the patient has consented to  Procedure(s): INSERTION OF DIALYSIS CATHETER (Right) as a surgical intervention.  The patient's history has been reviewed, patient examined, no change in status, stable for surgery.  I have reviewed the patient's chart and labs.  Questions were answered to the patient's satisfaction.     Virl Cagey

## 2020-08-13 NOTE — Progress Notes (Signed)
AM dose of Lasix was completed in Pre-Op prior to surgery.

## 2020-08-13 NOTE — Progress Notes (Signed)
PROGRESS NOTE    Julie [name]  [id number] DOB: 09-13-1950 DOA: 08/08/2020 PCP: Rosita Fire, MD   Brief Narrative:   Julie Alsop Adamsis a 70 y.o.femalewith medical history significant forCKD 5, OSA, diabetes mellitus, hypothyroidism, systolic CHF, hypertension and obstructive sleep apnea. Patient presented to the ED with complaints of leg swelling extending up to her thighs and difficulty breathing. Patient saw her nephrologistDr. Alm Bustard the day of admission and was referred to the ED.she is currently diuresing with IV Lasix with plans for tunneled catheter placement and hemodialysis initiation 4/25.  Assessment & Plan:   Principal Problem:   Decompensated heart failure (HCC) Active Problems:   OSA (obstructive sleep apnea)   History of hypothyroidism   History of type 2 diabetes mellitus   Chronic systolic CHF (congestive heart failure) (HCC)   Stage 5 chronic kidney disease not on chronic dialysis ([place])   A-V fistula (HCC)   Acute on chronic systolic congestive heart failure decompensation -Repeat 2D echocardiogram with LVEF 40% which is similar to prior at approximately 45% on 10/21 -LV dysfunction again noted -IV Lasix increased to 120 mg twice daily -Continue to monitor strict I's and O's as well as daily weights -She has had approximately2.1L of urine output documented since admission  CKD 5 -Severe volume overload as noted above -Appreciate plans for tunneled catheter placement on 4/25 and initiation of hemodialysis -Patient has left upper extremity fistula that was recently placed on 4/15 per patient -[place] of BLE negative for DVT  Atrial fibrillation -On Eliquis at home which has been heldfor tunneled catheter on 4/25 -Plan to likely resume after placement -Continue metoprolol -Telemetry monitoring and IV metoprolol as needed for episodes of RVR  Left hip pain -Pelvic x-ray with no acute abnormality -Likely sciatica versus bursitis -Continue  oral pain medications as needed -PT evaluation by am -Typically ambulates with cane  Type 2 diabetes -Hemoglobin A1c 7.4% -Continue SSI -Resume home Ozempic  Chronic anemia -Currently at baseline -Likely anemia of chronic disease related to CKD -Aranesp on 4/22 per Nephrology  Hypertension-stable -Continue home metoprolol -Monitor closely on aggressive IV Lasix diuresis  Morbid obesity with OSA -Lifestyle changes outpatient -CPAP at bedtime   DVT prophylaxis:Full dose Lovenox, to be held starting 4/24 Code Status:Full Family Communication:None at bedside, pt will call family Disposition Plan: Status is: Inpatient  Remains inpatient appropriate because:IV treatments appropriate due to intensity of illness or inability to take PO and Inpatient level of care appropriate due to severity of illness   Dispo: The patient is from:Home Anticipated d/c is [id number] Patient currently is not medically stable to d/c. Difficult to place patient No  Consultants:  Nephrology  General Surgery  Procedures:  See below  Antimicrobials:  None  Subjective: Patient seen and evaluated today with no new acute complaints or concerns. No acute concerns or events noted overnight.  Objective: Vitals:   08/12/20 2034 08/13/20 0500 08/13/20 0658 08/13/20 0853  BP: 136/73  126/65 127/71  Pulse: 79  73 62  Resp: '18  20 16  '$ Temp: 98.3 F (36.8 C)  98.3 F (36.8 C) 97.6 F (36.4 C)  TempSrc: Oral  Oral Oral  SpO2: 100%  99% 98%  Weight:  (!) 166.7 kg  (!) 166.7 kg  Height:    '5\' 8"'$  (1.727 m)    Intake/Output Summary (Last 24 hours) at 08/13/2020 1210 Last data filed at 08/13/2020 0700 Gross per 24 hour  Intake 600 ml  Output 1150 ml  Net -550 ml  Filed Weights   08/12/20 0700 08/13/20 0500 08/13/20 0853  Weight: (!) 168.9 kg (!) 166.7 kg (!) 166.7 kg    Examination:  General exam: Appears calm and comfortable,  morbidly obese Respiratory system: Clear to auscultation. Respiratory effort normal. Cardiovascular system: S1 & S2 heard, RRR.  Gastrointestinal system: Abdomen is soft Central nervous system: Alert and awake Extremities: bilateral LE edema Skin: No significant lesions noted Psychiatry: Flat affect.    Data Reviewed: I have personally reviewed following labs and imaging studies  CBC: Recent Labs  Lab 08/08/20 1305 08/10/20 0514 08/13/20 0444  WBC 10.6* 7.7 5.7  HGB 9.7* 8.8* 9.1*  HCT 32.2* 28.3* 29.8*  MCV 95.8 95.0 94.0  PLT 245 243 [id number]   Basic Metabolic Panel: Recent Labs  Lab 08/09/20 0500 08/10/20 0514 08/11/20 0446 08/12/20 0609 08/13/20 0444  NA 140 137 138 136 139  K 4.0 3.8 3.8 3.7 3.8  CL 102 99 100 99 100  CO2 '25 26 28 27 27  '$ GLUCOSE 142* 158* 146* 207* 162*  BUN 120* 120* 118* 117* 112*  CREATININE 3.89* 3.69* 3.55* 3.47* 3.25*  CALCIUM 8.2* 7.9* 8.2* 8.4* 8.8*  MG  --   --  2.1 2.0  --    GFR: Estimated Creatinine Clearance: 27.1 mL/min (A) (by C-G formula based on SCr of 3.25 mg/dL (H)). Liver Function Tests: No results for input(s): AST, ALT, ALKPHOS, BILITOT, PROT, ALBUMIN in the last 168 hours. No results for input(s): LIPASE, AMYLASE in the last 168 hours. No results for input(s): AMMONIA in the last 168 hours. Coagulation Profile: No results for input(s): INR, PROTIME in the last 168 hours. Cardiac Enzymes: No results for input(s): CKTOTAL, CKMB, CKMBINDEX, TROPONINI in the last 168 hours. BNP (last 3 results) No results for input(s): PROBNP in the last 8760 hours. HbA1C: Recent Labs    08/11/20 1205  HGBA1C 7.2*   CBG: Recent Labs  Lab 08/12/20 1145 08/12/20 1658 08/12/20 2034 08/13/20 0748 08/13/20 0849  GLUCAP 192* 279* 261* 143* 138*   Lipid Profile: No results for input(s): CHOL, HDL, LDLCALC, TRIG, CHOLHDL, LDLDIRECT in the last 72 hours. Thyroid Function Tests: No results for input(s): TSH, T4TOTAL, FREET4, T3FREE,  THYROIDAB in the last 72 hours. Anemia Panel: No results for input(s): VITAMINB12, FOLATE, FERRITIN, TIBC, IRON, RETICCTPCT in the last 72 hours. Sepsis Labs: No results for input(s): PROCALCITON, LATICACIDVEN in the last 168 hours.  Recent Results (from the past 240 hour(s))  Resp Panel by RT-PCR (Flu A&B, Covid) Nasopharyngeal Swab     Status: None   Collection Time: 08/08/20  4:30 PM   Specimen: Nasopharyngeal Swab; Nasopharyngeal(NP) swabs in vial transport medium  Result Value Ref Range Status   SARS Coronavirus 2 by RT PCR NEGATIVE NEGATIVE Final    Comment: (NOTE) SARS-CoV-2 target nucleic acids are NOT DETECTED.  The SARS-CoV-2 RNA is generally detectable in upper respiratory specimens during the acute phase of infection. The lowest concentration of SARS-CoV-2 viral copies this assay can detect is 138 copies/mL. A negative result does not preclude SARS-Cov-2 infection and should not be used as the sole basis for treatment or other patient management decisions. A negative result may occur with  improper specimen collection/handling, submission of specimen other than nasopharyngeal swab, presence of viral mutation(s) within the areas targeted by this assay, and inadequate number of viral copies(<138 copies/mL). A negative result must be combined with clinical observations, patient history, and epidemiological information. The expected result is Negative.  Fact Sheet for  Patients:  EntrepreneurPulse.com.au  Fact Sheet for Healthcare Providers:  IncredibleEmployment.be  This test is no t yet approved or cleared by the [place] FDA and  has been authorized for detection and/or diagnosis of SARS-CoV-2 by FDA under an Emergency Use Authorization (EUA). This EUA will remain  in effect (meaning this test can be used) for the duration of the COVID-19 declaration under Section 564(b)(1) of the Act, 21 U.S.C.section 360bbb-3(b)(1), unless the  authorization is terminated  or revoked sooner.       Influenza A by PCR NEGATIVE NEGATIVE Final   Influenza B by PCR NEGATIVE NEGATIVE Final    Comment: (NOTE) The Xpert Xpress SARS-CoV-2/FLU/RSV plus assay is intended as an aid in the diagnosis of influenza from Nasopharyngeal swab specimens and should not be used as a sole basis for treatment. Nasal washings and aspirates are unacceptable for Xpert Xpress SARS-CoV-2/FLU/RSV testing.  Fact Sheet for Patients: EntrepreneurPulse.com.au  Fact Sheet for Healthcare Providers: IncredibleEmployment.be  This test is not yet approved or cleared by the [place] FDA and has been authorized for detection and/or diagnosis of SARS-CoV-2 by FDA under an Emergency Use Authorization (EUA). This EUA will remain in effect (meaning this test can be used) for the duration of the COVID-19 declaration under Section 564(b)(1) of the Act, 21 U.S.C. section 360bbb-3(b)(1), unless the authorization is terminated or revoked.  Performed at [facility], [address] [place], [place] [address]   Surgical pcr screen     Status: Abnormal   Collection Time: 08/13/20  7:45 AM   Specimen: Nasal Mucosa; Nasal Swab  Result Value Ref Range Status   MRSA, PCR NEGATIVE NEGATIVE Final   Staphylococcus aureus POSITIVE (A) NEGATIVE Final    Comment: RESULT CALLED TO, READ BACK BY AND VERIFIED WITH: WATSON,K AT [id number] BY HUFFINES,S ON 08/13/20. (NOTE) The Xpert SA Assay (FDA approved for NASAL specimens in patients 70 years of age and older), is one component of a comprehensive surveillance program. It is not intended to diagnose infection nor to guide or monitor treatment. Performed at [facility], [address], [place], [place] [address]          Radiology Studies: No results found.      Scheduled Meds: . [MAR Hold] calcitRIOL  0.25 mcg Oral Daily  . [MAR Hold] Chlorhexidine Gluconate Cloth  6 each  Topical Q0600  . [MAR Hold] gabapentin  100 mg Oral TID  . [MAR Hold] insulin aspart  0-5 Units Subcutaneous QHS  . [MAR Hold] insulin aspart  0-9 Units Subcutaneous TID WC  . [MAR Hold] insulin glargine  15 Units Subcutaneous QHS  . [MAR Hold] lipase/protease/amylase  12,000 Units Oral BID  . [MAR Hold] metoprolol succinate  50 mg Oral BID  . [MAR Hold] pantoprazole  40 mg Oral Daily  . [MAR Hold] Semaglutide(0.25 or 0.'5MG'$ /DOS)  0.25 mg Subcutaneous Once per day on Mon Thu   Continuous Infusions: . sodium chloride 10 mL/hr at 08/13/20 0936  . ceFAZolin    . [MAR Hold] furosemide 120 mg (08/13/20 0757)     LOS: 5 days    Time spent: 35 minutes    Macari Zalesky D Manuella Ghazi, DO [facility]  If 7PM-7AM, please contact night-coverage [email or web address] 08/13/2020, 12:10 PM

## 2020-08-13 NOTE — Progress Notes (Signed)
[facility]  Updated son Joselyn Glassman about catheter being completed. CXR ordered.  Curlene Labrum, MD [facility] [address] [place], [place] [address] [phone] (office)

## 2020-08-13 NOTE — Anesthesia Procedure Notes (Signed)
Procedure Name: Intubation Date/Time: 08/13/2020 11:35 AM Performed by: Hewitt Blade, CRNA Pre-anesthesia Checklist: Patient identified, Emergency Drugs available, Suction available and Patient being monitored Patient Re-evaluated:Patient Re-evaluated prior to induction Oxygen Delivery Method: Circle system utilized Preoxygenation: Pre-oxygenation with 100% oxygen Induction Type: IV induction Ventilation: Mask ventilation without difficulty Laryngoscope Size: Mac and 3 Grade View: Grade I Tube type: Oral Tube size: 7.0 mm Number of attempts: 1 Airway Equipment and Method: Stylet Placement Confirmation: ETT inserted through vocal cords under direct vision,  positive ETCO2 and breath sounds checked- equal and bilateral Secured at: 21 cm Tube secured with: Tape Dental Injury: Teeth and Oropharynx as per pre-operative assessment

## 2020-08-13 NOTE — Progress Notes (Signed)
[facility]  CXR with dialysis access in good position. No ptx. Hold anticoagulation until 4/26.  Curlene Labrum, MD [facility] [address] [place], [place] [address] [phone] (office)

## 2020-08-13 NOTE — Anesthesia Postprocedure Evaluation (Signed)
Anesthesia Post Note  Patient: Julie [name]  Procedure(s) Performed: INSERTION OF DIALYSIS CATHETER (Right )  Patient location during evaluation: PACU Anesthesia Type: General Level of consciousness: awake and alert, oriented and sedated Pain management: pain level controlled Respiratory status: spontaneous breathing and respiratory function stable Cardiovascular status: blood pressure returned to baseline and stable Postop Assessment: no apparent nausea or vomiting Anesthetic complications: no   No complications documented.   Last Vitals:  Vitals:   08/13/20 1300 08/13/20 1315  BP: (!) 152/79 (!) 154/74  Pulse: 71 72  Resp: 14 14  Temp:    SpO2: 100% 100%    Last Pain:  Vitals:   08/13/20 1315  TempSrc:   PainSc: 0-No pain                 Daveon Arpino C Amulya Quintin

## 2020-08-13 NOTE — Anesthesia Preprocedure Evaluation (Signed)
Anesthesia Evaluation  Patient identified by MRN, date of birth, ID band Patient awake    Reviewed: Allergy & Precautions, NPO status , Patient's Chart, lab work & pertinent test results, reviewed documented beta blocker date and time   Airway Mallampati: II  TM Distance: >3 FB Neck ROM: Full    Dental  (+) Dental Advisory Given, Edentulous Upper, Missing   Pulmonary sleep apnea ,    Pulmonary exam normal breath sounds clear to auscultation       Cardiovascular Exercise Tolerance: Poor hypertension, Pt. on medications and Pt. on home beta blockers +CHF (EF 40 %)  Normal cardiovascular exam Rhythm:Regular Rate:Normal  1. Left ventricular ejection fraction, by estimation, is 40%. The left ventricle has moderately decreased function. The left ventricle demonstrates global hypokinesis. There is mild left ventricular hypertrophy. Left ventricular diastolic parameters are indeterminate.  2. Right ventricular systolic function is normal. The right ventricular size is normal.  3. Left atrial size was mildly dilated.  4. Right atrial size was mildly dilated.  5. Poorly visualized MR by color Doppler, by CW spectrum waveform would suggest at least moderate MR. The mitral valve is abnormal. Mild to moderate mitral valve regurgitation. No evidence of mitral stenosis.  6. The tricuspid valve is abnormal. Tricuspid valve regurgitation is mild to moderate.  7. The aortic valve is tricuspid. There is mild calcification of the aortic valve. There is mild thickening of the aortic valve. Aortic valve regurgitation is not visualized. No aortic stenosis is present.  8. The inferior vena cava is dilated in size with >50% respiratory variability, suggesting right atrial pressure of 8 mmHg.   11-Aug-2020 00:19:28 [facility]-300 ROUTINE RECORD [phone] (53 yr) Female Black Room:A317 Loc:903 Technician: [id number] Test  [id number] Vent. rate 81 BPM PR interval 190 ms QRS duration 80 ms QT/QTcB 422/490 ms P-R-T axes 54 18 52 Sinus rhythm with Premature atrial complexes Prolonged QT Abnormal ECG    Neuro/Psych negative neurological ROS  negative psych ROS   GI/Hepatic Neg liver ROS, GERD  Medicated and Controlled,  Endo/Other  diabetes, Well Controlled, Type 2, Insulin DependentHypothyroidism Morbid obesity  Renal/GU ESRFRenal disease     Musculoskeletal negative musculoskeletal ROS (+)   Abdominal   Peds  Hematology negative hematology ROS (+)   Anesthesia Other Findings   Reproductive/Obstetrics negative OB ROS                             Anesthesia Physical Anesthesia Plan  ASA: IV  Anesthesia Plan: General   Post-op Pain Management:    Induction: Intravenous  PONV Risk Score and Plan: Ondansetron  Airway Management Planned: Oral ETT  Additional Equipment:   Intra-op Plan:   Post-operative Plan:   Informed Consent:   Plan Discussed with:   Anesthesia Plan Comments:         Anesthesia Quick Evaluation

## 2020-08-13 NOTE — Op Note (Signed)
Operative Note 08/13/20   Preoperative Diagnosis: End Stage Renal Disease    Postoperative Diagnosis: Same   Procedure(s) Performed: Tunneled Dialysis Catheter Placement, Right Internal Jugular    Surgeon: Lanell Matar. Constance Haw, MD   Assistants: No qualified resident was available   Anesthesia: General anesthesia    Anesthesiologist: Denese Killings, MD    Specimens: None   Estimated Blood Loss: Minimal   Fluoroscopy time: 10 seconds   Blood Replacement: None    Complications: None    Operative Findings: Normal anatomy  Indications:   Procedure: The patient was brought into the operating room and general anesthia was induced.   The right chest and neck was prepped and draped in the usual sterile fashion.  Preoperative antibiotics were given.   An Ultrasound was used to verify that the right internal jugular vein was patent.  One percent lidocaine was used for local anesthesia.  The patient was measured and a 23 cm Palindrome dual lumen dialysis catheter.  The needles advanced into the right internal jugular vein using the Seldinger technique without difficulty.  A guidewire was then advanced into the right atrium under fluoroscopic guidance.  Ectopia was noted and the wire was pulled back.  The wire was secured.  An incision was made over the right chest and the catheter was tunneled to the neck.  The ultrasound again confirmed the wire was going into the vein only. Dilators were used over the wire to dilate the track.  An introducer and peel-away sheath were placed over the guidewire. The catheter was then inserted through the peel-away sheath and the peel-away sheath was removed.  A spot film was performed to confirm the position.  The catheter drew back and flushed easily. The lumens were packed with heparin. Hemostats were used to position the catheter in the neck incision. The neck incision was closed with 4-0 Monocryl and Dermabond. The catheter was secured with 2-0 silk suture  and a sterile Biopatch and dressing was applied.  Hemostasis was confirmed.     All tape and needle counts were correct at the end of the procedure. The patient was transferred to PACU in stable condition. A chest x-ray will be performed at that time.  Curlene Labrum, MD [facility] [address] [place], [place] [address] [phone] (office)

## 2020-08-13 NOTE — Progress Notes (Signed)
Patient ID: Julie [name], female   DOB: 1950/11/16, 70 y.o.   MRN: [id number] S: Feels well today, no complaints.  O:BP 126/65 (BP Location: Right Wrist)   Pulse 73   Temp 98.3 F (36.8 C) (Oral)   Resp 20   Ht '5\' 8"'$  (1.727 m)   Wt (!) 166.7 kg   SpO2 99%   BMI 55.88 kg/m   Intake/Output Summary (Last 24 hours) at 08/13/2020 0828 Last data filed at 08/13/2020 0700 Gross per 24 hour  Intake 840 ml  Output 1150 ml  Net -310 ml   Intake/Output: I/O last 3 completed shifts: In: 1200 [P.O.:960; Other:120; IV Piggyback:120] Out: 1150 [Urine:1150]  Intake/Output this shift:  No intake/output data recorded. Weight change: -2.2 kg Gen: NAD CVS: RRR Resp: CTA Abd: obese, +BS, soft, NT/ND Ext: 1+ BLE edema with tenderness of right leg, LUE AVF +T/B, bandage in place, small blister on lateral aspect of AVF, non-pulsatile.   Recent Labs  Lab 08/08/20 1305 08/09/20 0500 08/10/20 0514 08/11/20 0446 08/12/20 0609 08/13/20 0444  NA 136 140 137 138 136 139  K 4.0 4.0 3.8 3.8 3.7 3.8  CL 98 102 99 100 99 100  CO2 '25 25 26 28 27 27  '$ GLUCOSE 131* 142* 158* 146* 207* 162*  BUN 128* 120* 120* 118* 117* 112*  CREATININE 4.40* 3.89* 3.69* 3.55* 3.47* 3.25*  CALCIUM 8.0* 8.2* 7.9* 8.2* 8.4* 8.8*   Liver Function Tests: No results for input(s): AST, ALT, ALKPHOS, BILITOT, PROT, ALBUMIN in the last 168 hours. No results for input(s): LIPASE, AMYLASE in the last 168 hours. No results for input(s): AMMONIA in the last 168 hours. CBC: Recent Labs  Lab 08/08/20 1305 08/10/20 0514 08/13/20 0444  WBC 10.6* 7.7 5.7  HGB 9.7* 8.8* 9.1*  HCT 32.2* 28.3* 29.8*  MCV 95.8 95.0 94.0  PLT 245 243 228   Cardiac Enzymes: No results for input(s): CKTOTAL, CKMB, CKMBINDEX, TROPONINI in the last 168 hours. CBG: Recent Labs  Lab 08/12/20 0744 08/12/20 1145 08/12/20 1658 08/12/20 2034 08/13/20 0748  GLUCAP 176* 192* 279* 261* 143*    Iron Studies: No results for input(s): IRON, TIBC,  TRANSFERRIN, FERRITIN in the last 72 hours. Studies/Results: No results found. . calcitRIOL  0.25 mcg Oral Daily  . Chlorhexidine Gluconate Cloth  6 each Topical Q0600  . gabapentin  100 mg Oral TID  . insulin aspart  0-5 Units Subcutaneous QHS  . insulin aspart  0-9 Units Subcutaneous TID WC  . insulin glargine  15 Units Subcutaneous QHS  . lipase/protease/amylase  12,000 Units Oral BID  . metoprolol succinate  50 mg Oral BID  . pantoprazole  40 mg Oral Daily  . Semaglutide(0.25 or 0.'5MG'$ /DOS)  0.25 mg Subcutaneous Once per day on Mon Thu    BMET    Component Value Date/Time   NA 139 08/13/2020 0444   K 3.8 08/13/2020 0444   CL 100 08/13/2020 0444   CO2 27 08/13/2020 0444   GLUCOSE 162 (H) 08/13/2020 0444   BUN 112 (H) 08/13/2020 0444   CREATININE 3.25 (H) 08/13/2020 0444   CREATININE 4.65 (H) 08/31/2019 1445   CALCIUM 8.8 (L) 08/13/2020 0444   GFRNONAA 15 (L) 08/13/2020 0444   GFRNONAA 9 (L) 08/31/2019 1445   GFRAA 10 (L) 08/31/2019 1445   CBC    Component Value Date/Time   WBC 5.7 08/13/2020 0444   RBC 3.17 (L) 08/13/2020 0444   HGB 9.1 (L) 08/13/2020 0444   HCT 29.8 (  L) 08/13/2020 0444   PLT 228 08/13/2020 0444   MCV 94.0 08/13/2020 0444   MCH 28.7 08/13/2020 0444   MCHC 30.5 08/13/2020 0444   RDW 13.3 08/13/2020 0444     Assessment/Plan:  1. New ESRD- for [facility] today and first HD session.  Will need outpatient HD to be arranged. 2. Acute on chronic systolic and diastolic CHF- will UF with HD 3. HTN - stable 4. A fib - per primary 5. Secondary HPTH - started on calcitriol and will follow Ca/phos 6. Anemia of CKD - started on ESA and follow iron stores 7. Vascular access - LUE AVF s/p revision recently at [facility].  For [facility] placement today. 8. Disposition - will need to have outpatient dialysis arranged prior to discharge.   Donetta Potts, MD [employer] [phone]

## 2020-08-13 NOTE — TOC Progression Note (Signed)
Transition of Care [facility]) - Progression Note    Patient Details  Name: DAMESHA POSTLETHWAITE MRN: [id number] Date of Birth: 1950-09-08  Transition of [facility]) CM/SW Contact  Shade Flood, LCSW Phone Number: 08/13/2020, 12:43 PM  Clinical Narrative:     TOC following for outpatient HD arrangements. Additional clinical faxed to [facility] today. Awaiting approval and assignment of chair time. TOC will follow.  Expected Discharge Plan: Home/Self Care Barriers to Discharge: Continued Medical Work up  Expected Discharge Plan and Services Expected Discharge Plan: Home/Self Care In-house Referral: Clinical Social Work Discharge Planning Services: CM Consult Post Acute Care Choice: NA Living arrangements for the past 2 months: Single Family Home                                       Social Determinants of Health (SDOH) Interventions    Readmission Risk Interventions Readmission Risk Prevention Plan 08/09/2020  Transportation Screening Complete  Home Care Screening Complete  Medication Review (RN CM) Complete  Some recent data might be hidden

## 2020-08-14 ENCOUNTER — Encounter (HOSPITAL_COMMUNITY): Payer: Self-pay | Admitting: General Surgery

## 2020-08-14 LAB — RENAL FUNCTION PANEL
Albumin: 3.3 g/dL — ABNORMAL LOW (ref 3.5–5.0)
Anion gap: 12 (ref 5–15)
BUN: 77 mg/dL — ABNORMAL HIGH (ref 8–23)
CO2: 27 mmol/L (ref 22–32)
Calcium: 8.8 mg/dL — ABNORMAL LOW (ref 8.9–10.3)
Chloride: 100 mmol/L (ref 98–111)
Creatinine, Ser: 2.88 mg/dL — ABNORMAL HIGH (ref 0.44–1.00)
GFR, Estimated: 17 mL/min — ABNORMAL LOW (ref >60)
Glucose, Bld: 186 mg/dL — ABNORMAL HIGH (ref 70–99)
Phosphorus: 3.7 mg/dL (ref 2.5–4.6)
Potassium: 4 mmol/L (ref 3.5–5.1)
Sodium: 139 mmol/L (ref 135–145)

## 2020-08-14 LAB — GLUCOSE, CAPILLARY
Glucose-Capillary: 167 mg/dL — ABNORMAL HIGH (ref 70–99)
Glucose-Capillary: 213 mg/dL — ABNORMAL HIGH (ref 70–99)
Glucose-Capillary: 190 mg/dL — ABNORMAL HIGH (ref 70–99)
Glucose-Capillary: 222 mg/dL — ABNORMAL HIGH (ref 70–99)

## 2020-08-14 LAB — MAGNESIUM: Magnesium: 1.9 mg/dL (ref 1.7–2.4)

## 2020-08-14 MED ORDER — APIXABAN 5 MG PO TABS
5.0000 mg | ORAL_TABLET | Freq: Two times a day (BID) | ORAL | Status: DC
  Administered 2020-08-14 – 2020-08-15 (×3): 5 mg via ORAL
  Filled 2020-08-14 (×3): qty 1

## 2020-08-14 MED ORDER — HEPARIN SODIUM (PORCINE) 1000 UNIT/ML IJ SOLN
3800.0000 [IU] | Freq: Once | INTRAMUSCULAR | Status: AC
  Administered 2020-08-14: 3800 [IU]

## 2020-08-14 NOTE — Progress Notes (Signed)
Patient ID: Julie [name], female   DOB: 10/17/1950, 70 y.o.   MRN: [id number]  S: no acute events overnight. S/p tdc placement with gen surg 4/25, s/p hd #1 yesterday with 0.5L net UF, #2 treatment today. Tolerated hd yesterday. She reports breathing is fine but does not feel well today but not elaborating  O:BP 126/68 (BP Location: Right Wrist)   Pulse 76   Temp 97.7 F (36.5 C) (Oral)   Resp 20   Ht '5\' 8"'$  (1.727 m)   Wt (!) 165.2 kg   SpO2 99%   BMI 55.38 kg/m   Intake/Output Summary (Last 24 hours) at 08/14/2020 0744 Last data filed at 08/14/2020 0500 Gross per 24 hour  Intake 800 ml  Output 2250 ml  Net -1450 ml   Intake/Output: I/O last 3 completed shifts: In: 800 [I.V.:800] Out: 3400 [Urine:2900; Other:500]  Intake/Output this shift:  No intake/output data recorded. Weight change: 0 kg Gen: NAD CVS: RRR Resp: CTA Abd: obese, +BS, soft, NT/ND Ext: 1+ BLE edema, LUE AVF +T/B, bandage in place, small blister on lateral aspect of AVF, non-pulsatile.  Dialysis Access: lue avf +t/b, rij tdc c/d/i  Recent Labs  Lab 08/08/20 1305 08/09/20 0500 08/10/20 0514 08/11/20 0446 08/12/20 0609 08/13/20 0444  NA 136 140 137 138 136 139  K 4.0 4.0 3.8 3.8 3.7 3.8  CL 98 102 99 100 99 100  CO2 '25 25 26 28 27 27  '$ GLUCOSE 131* 142* 158* 146* 207* 162*  BUN 128* 120* 120* 118* 117* 112*  CREATININE 4.40* 3.89* 3.69* 3.55* 3.47* 3.25*  CALCIUM 8.0* 8.2* 7.9* 8.2* 8.4* 8.8*   Liver Function Tests: No results for input(s): AST, ALT, ALKPHOS, BILITOT, PROT, ALBUMIN in the last 168 hours. No results for input(s): LIPASE, AMYLASE in the last 168 hours. No results for input(s): AMMONIA in the last 168 hours. CBC: Recent Labs  Lab 08/08/20 1305 08/10/20 0514 08/13/20 0444  WBC 10.6* 7.7 5.7  HGB 9.7* 8.8* 9.1*  HCT 32.2* 28.3* 29.8*  MCV 95.8 95.0 94.0  PLT 245 243 228   Cardiac Enzymes: No results for input(s): CKTOTAL, CKMB, CKMBINDEX, TROPONINI in the last 168  hours. CBG: Recent Labs  Lab 08/13/20 0849 08/13/20 1332 08/13/20 1641 08/13/20 2044 08/14/20 0726  GLUCAP 138* 122* 121* 192* 167*    Iron Studies: No results for input(s): IRON, TIBC, TRANSFERRIN, FERRITIN in the last 72 hours. Studies/Results: DG Chest Port 1 View  Result Date: 08/13/2020 CLINICAL DATA:  Vascular dialysis catheter placement EXAM: PORTABLE CHEST 1 VIEW COMPARISON:  08/08/2020 chest radiograph. FINDINGS: Right internal jugular central venous catheter terminates over the cavoatrial junction. Stable cardiomediastinal silhouette with mild to moderate cardiomegaly. No pneumothorax. No pleural effusion. Mild-to-moderate pulmonary edema. Surgical clips overlie the lower right chest and bilateral axilla. IMPRESSION: Well-positioned right internal jugular central venous catheter. No pneumothorax. Mild-to-moderate congestive heart failure. Electronically Signed   By: Ilona Sorrel M.D.   On: 08/13/2020 13:09   DG C-Arm 1-60 Min-No Report  Result Date: 08/13/2020 Fluoroscopy was utilized by the requesting physician.  No radiographic interpretation.   Marland Kitchen apixaban  5 mg Oral BID  . calcitRIOL  0.25 mcg Oral Daily  . chlorhexidine  15 mL Mouth/Throat Once   Or  . mouth rinse  15 mL Mouth Rinse Once  . Chlorhexidine Gluconate Cloth  6 each Topical Q0600  . gabapentin  100 mg Oral TID  . insulin aspart  0-5 Units Subcutaneous QHS  . insulin aspart  0-9 Units Subcutaneous TID WC  . insulin glargine  15 Units Subcutaneous QHS  . lipase/protease/amylase  12,000 Units Oral BID  . metoprolol succinate  50 mg Oral BID  . mupirocin ointment  1 application Nasal BID  . pantoprazole  40 mg Oral Daily  . Semaglutide(0.25 or 0.'5MG'$ /DOS)  0.25 mg Subcutaneous Once per day on Mon Thu    BMET    Component Value Date/Time   NA 139 08/13/2020 0444   K 3.8 08/13/2020 0444   CL 100 08/13/2020 0444   CO2 27 08/13/2020 0444   GLUCOSE 162 (H) 08/13/2020 0444   BUN 112 (H) 08/13/2020 0444    CREATININE 3.25 (H) 08/13/2020 0444   CREATININE 4.65 (H) 08/31/2019 1445   CALCIUM 8.8 (L) 08/13/2020 0444   GFRNONAA 15 (L) 08/13/2020 0444   GFRNONAA 9 (L) 08/31/2019 1445   GFRAA 10 (L) 08/31/2019 1445   CBC    Component Value Date/Time   WBC 5.7 08/13/2020 0444   RBC 3.17 (L) 08/13/2020 0444   HGB 9.1 (L) 08/13/2020 0444   HCT 29.8 (L) 08/13/2020 0444   PLT 228 08/13/2020 0444   MCV 94.0 08/13/2020 0444   MCH 28.7 08/13/2020 0444   MCHC 30.5 08/13/2020 0444   RDW 13.3 08/13/2020 0444     Assessment/Plan:  1. New ESRD- s/p [facility] placement 4/25. HD started 4/25, #2 treatment today, #3 tomorrow. Will need outpatient HD to be arranged. 2. Acute on chronic systolic and diastolic CHF- will UF with HD 3. HTN - stable 4. A fib - per primary 5. Secondary HPTH - started on calcitriol and will follow Ca/phos 6. Anemia of CKD - started on ESA and follow iron stores 7. Vascular access - LUE AVF s/p revision recently at [facility].  TDC placement 4/25, appreciate assistance from surgery 8. Disposition - will need to have outpatient dialysis arranged prior to discharge.   Gean Quint, MD [facility]

## 2020-08-14 NOTE — Plan of Care (Signed)
  Problem: Acute Rehab PT Goals(only PT should resolve) Goal: Pt Will Go Supine/Side To Sit Outcome: Progressing Flowsheets (Taken 08/14/2020 1226) Pt will go Supine/Side to Sit: with modified independence Goal: Patient Will Transfer Sit To/From Stand Outcome: Progressing Flowsheets (Taken 08/14/2020 1226) Patient will transfer sit to/from stand: with modified independence Goal: Pt Will Ambulate Outcome: Progressing Flowsheets (Taken 08/14/2020 1226) Pt will Ambulate:  > 125 feet  with supervision  with rolling walker Goal: Pt/caregiver will Perform Home Exercise Program Outcome: Progressing Flowsheets (Taken 08/14/2020 1226) Pt/caregiver will Perform Home Exercise Program:  For increased ROM  For increased strengthening  For improved balance  Independently   Tori Temprance Wyre PT, DPT 08/14/20, 12:27 PM

## 2020-08-14 NOTE — Procedures (Signed)
Hill A. Merlene Laughter, MD     [email or web address]             HOME SLEEP STUDY  LOCATION: [facility]   Patient Name: Julie [name], Julie [name] Date: 08/01/2020 Gender: Female D.O.B: [birth year] Age (years): 23 Referring Provider: Barton Fanny NP Height (inches): 82 Interpreting Physician: Phillips Odor MD, ABSM Weight (lbs): 358 RPSGT: Jacolyn Reedy BMI: 66 MRN: [id number] Neck Size: CLINICAL INFORMATION Sleep Study Type: HST     Indication for sleep study: N/A     Epworth Sleepiness Score: 18  SLEEP STUDY TECHNIQUE A multi-channel overnight portable sleep study was performed. The channels recorded were: nasal airflow, thoracic respiratory movement, and oxygen saturation with a pulse oximetry. Snoring was also monitored.  MEDICATIONS Patient self administered medications include: N/A. No current facility-administered medications for this visit. No current outpatient medications on file.  Facility-Administered Medications Ordered in Other Visits:  .  0.9 %  sodium chloride infusion, 100 mL, Intravenous, PRN, Harrie Jeans C, MD .  0.9 %  sodium chloride infusion, 100 mL, Intravenous, PRN, Claudia Desanctis, MD .  acetaminophen (TYLENOL) tablet 650 mg, 650 mg, Oral, Q6H PRN, 650 mg at 08/13/20 1732 **OR** acetaminophen (TYLENOL) suppository 650 mg, 650 mg, Rectal, Q6H PRN, Emokpae, Ejiroghene E, MD .  albuterol (PROVENTIL) (2.5 MG/3ML) 0.083% nebulizer solution 2.5 mg, 2.5 mg, Nebulization, Q4H PRN, Emokpae, Ejiroghene E, MD .  alteplase (CATHFLO ACTIVASE) injection 2 mg, 2 mg, Intracatheter, Once PRN, Claudia Desanctis, MD .  anticoagulant sodium citrate solution 5 mL, 5 mL, Intracatheter, Once, Donato Heinz, MD .  apixaban Arne Cleveland) tablet 5 mg, 5 mg, Oral, BID, Manuella Ghazi, Pratik D, DO, 5 mg at 08/14/20 0814 .  calcitRIOL (ROCALTROL) capsule 0.25 mcg, 0.25 mcg, Oral, Daily, Claudia Desanctis, MD, 0.25 mcg at 08/14/20 [phone] .  chlorhexidine  (PERIDEX) 0.12 % solution 15 mL, 15 mL, Mouth/Throat, Once **OR** MEDLINE mouth rinse, 15 mL, Mouth Rinse, Once, Battula, Rajamani C, MD .  Chlorhexidine Gluconate Cloth 2 % PADS 6 each, 6 each, Topical, Q0600, Heath Lark D, DO, 6 each at 08/14/20 [phone] .  furosemide (LASIX) 120 mg in dextrose 5 % 50 mL IVPB, 120 mg, Intravenous, Q12H, Shah, Pratik D, DO, Stopped at 08/13/20 2045 .  gabapentin (NEURONTIN) capsule 100 mg, 100 mg, Oral, TID, Emokpae, Ejiroghene E, MD, 100 mg at 08/14/20 0814 .  heparin injection 1,000 Units, 1,000 Units, Dialysis, PRN, Claudia Desanctis, MD .  HYDROcodone-acetaminophen [facility]) 10-325 MG per tablet 1 tablet, 1 tablet, Oral, Q6H PRN, Emokpae, Ejiroghene E, MD, 1 tablet at 08/13/20 2104 .  insulin aspart (novoLOG) injection 0-5 Units, 0-5 Units, Subcutaneous, QHS, Heath Lark D, DO, 3 Units at 08/12/20 2131 .  insulin aspart (novoLOG) injection 0-9 Units, 0-9 Units, Subcutaneous, TID WC, Shah, Pratik D, DO, 2 Units at 08/14/20 0815 .  insulin glargine (LANTUS) injection 15 Units, 15 Units, Subcutaneous, Lelan Pons, RPH, 15 Units at 08/13/20 2105 .  lactated ringers infusion, , Intravenous, Continuous, Battula, Rajamani C, MD .  lidocaine (PF) (XYLOCAINE) 1 % injection 5 mL, 5 mL, Intradermal, PRN, Claudia Desanctis, MD .  lidocaine-prilocaine (EMLA) cream 1 application, 1 application, Topical, PRN, Claudia Desanctis, MD .  lipase/protease/amylase (CREON) capsule 12,000 Units, 12,000 Units, Oral, BID, Heath Lark D, DO, 12,000 Units at 08/14/20 [phone] .  metoprolol succinate (TOPROL-XL) 24 hr tablet 50 mg, 50 mg, Oral, BID, Emokpae, Ejiroghene E, MD, 50 mg at 08/13/20 2104 .  metoprolol tartrate (LOPRESSOR) injection 5 mg, 5 mg, Intravenous, Q6H PRN, Manuella Ghazi, Pratik D, DO, 5 mg at 08/09/20 0923 .  mupirocin ointment (BACTROBAN) 2 % 1 application, 1 application, Nasal, BID, Heath Lark D, DO, 1 application at [phone] .  ondansetron (ZOFRAN) tablet 4 mg, 4 mg, Oral,  Q6H PRN **OR** ondansetron (ZOFRAN) injection 4 mg, 4 mg, Intravenous, Q6H PRN, Emokpae, Ejiroghene E, MD .  pantoprazole (PROTONIX) EC tablet 40 mg, 40 mg, Oral, Daily, Emokpae, Ejiroghene E, MD, 40 mg at 08/14/20 0814 .  pentafluoroprop-tetrafluoroeth (GEBAUERS) aerosol 1 application, 1 application, Topical, PRN, Harrie Jeans C, MD .  polyethylene glycol (MIRALAX / GLYCOLAX) packet 17 g, 17 g, Oral, Daily PRN, Emokpae, Ejiroghene E, MD .  Semaglutide(0.25 or 0.'5MG'$ /DOS) SOPN 0.25 mg, 0.25 mg, Subcutaneous, Once per day on Mon Thu, Shah, Pratik D, DO  SLEEP ARCHITECTURE Patient was studied for 458.2 minutes. The sleep efficiency was 100.0 % and the patient was supine for 53.3%. The arousal index was 0.0 per hour.  RESPIRATORY PARAMETERS The overall AHI was 19.8 per hour, with a central apnea index of 0 per hour.  The oxygen nadir was 79% during sleep.     CARDIAC DATA Mean heart rate during sleep was 85.4 bpm.  IMPRESSIONS  1. Moderate obstructive sleep apnea syndrome is documented with this study. Auto PAP 8-20 is recommended.    Delano Metz, MD Diplomate, American Board of Sleep Medicine.  ELECTRONICALLY SIGNED ON:  08/14/2020, 9:31 AM [place] PH: [phone]   FX: [phone] [place]

## 2020-08-14 NOTE — Evaluation (Addendum)
Physical Therapy Evaluation Patient Details Name: Julie [name] MRN: [id number] DOB: June 19, 1950 Today's Date: 08/14/2020   History of Present Illness  Julie [name] is a 70 y.o. female presents due to nephrologist referred her to ED due to leg swelling and difficulty breahting. 4/25 Tunneled Dialysis catheter placement, R Internal Jugular placed. PMH: CKD 5, OSA, diabetes mellitus, hypothyroidism, systolic CHF, HTN, morbid obesity, and obstructive sleep apnea.     Clinical Impression  Pt admitted with above diagnosis. Pt independent at baseline with [facility], son lives with her and works 1-11pm, and has 13 steps to enter home with L handrail. Pt currently powering up to stand and ambulating around room with SUPV and RW, but limited by L hip pain. Pt c/o L hip pain and R leg numbness during eval. Recommending SNF for short term strengthening vs home with HHPT if son/family is able to assist. Pt currently with functional limitations due to the deficits listed below (see PT Problem List). Pt will benefit from skilled PT to increase their independence and safety with mobility to allow discharge to the venue listed below.       Follow Up Recommendations SNF (vs home with HHPT and son assisting)    Equipment Recommendations  Other (comment) (bariatric rollator)    Recommendations for Other Services       Precautions / Restrictions Precautions Precautions: Fall Restrictions Weight Bearing Restrictions: No      Mobility  Bed Mobility  General bed mobility comments: in chair upon arrival    Transfers Overall transfer level: Needs assistance Equipment used: Rolling walker (2 wheeled) Transfers: Sit to/from Stand Sit to Stand: Supervision    General transfer comment: rocking momentum with BUE assisting to power up to stand, SUPV for safety  Ambulation/Gait Ambulation/Gait assistance: Supervision Gait Distance (Feet): 20 Feet Assistive device: Rolling walker (2 wheeled) Gait  Pattern/deviations: Step-to pattern;Decreased stride length;Decreased weight shift to left;Wide base of support Gait velocity: decreased   General Gait Details: slow step to pattern with decreased weight-shift L, good steadiness without LOB, limited by L hip pain  Stairs            Wheelchair Mobility    Modified Rankin (Stroke Patients Only)       Balance Overall balance assessment: Needs assistance  Standing balance support: During functional activity;Bilateral upper extremity supported Standing balance-Leahy Scale: Poor Standing balance comment: reliant on UE support        Pertinent Vitals/Pain Pain Assessment: 0-10 Pain Score: 7  Pain Location: L hip Pain Descriptors / Indicators: Aching Pain Intervention(s): Limited activity within patient's tolerance;Monitored during session    Home Living Family/patient expects to be discharged to:: Private residence Living Arrangements: Children (works 1 pm to 11 pm) Available Help at Discharge: Family;Available PRN/intermittently Type of Home: House Home Access: Stairs to enter Entrance Stairs-Rails: Left Entrance Stairs-Number of Steps: 13 Home Layout: Laundry or work area in basement;Able to live on main level with bedroom/bathroom Home Equipment: Kasandra Knudsen - single point;Shower seat - built in      Prior Function Level of Independence: Independent with assistive device(s)         Comments: Pt reports independent with ADLs/IADLs, drives, ambulates with SPC, denies falls. Pt reports recent L hip pain without injury causing increased difficulty with mobility, requiring increased time.     Hand Dominance   Dominant Hand: Right    Extremity/Trunk Assessment   Upper Extremity Assessment Upper Extremity Assessment: Overall WFL for tasks assessed    Lower Extremity  Assessment Lower Extremity Assessment: Overall WFL for tasks assessed;RLE deficits/detail;LLE deficits/detail RLE Deficits / Details: AROM WNL, strength  4+/5 throughout, reports numbness/tingling in lower leg RLE Sensation: decreased light touch LLE Deficits / Details: AROM WNL, strength 4+/5 knee and ankle, hip 4/5, denies numbness/tingling LLE Sensation: WNL    Cervical / Trunk Assessment Cervical / Trunk Assessment: Kyphotic  Communication   Communication: No difficulties  Cognition Arousal/Alertness: Awake/alert Behavior During Therapy: WFL for tasks assessed/performed Overall Cognitive Status: Within Functional Limits for tasks assessed       General Comments      Exercises General Exercises - Lower Extremity Long Arc Quad: Seated;AROM;Strengthening;Both;10 reps Hip Flexion/Marching: Seated;AROM;Strengthening;Both;10 reps   Assessment/Plan    PT Assessment Patient needs continued PT services  PT Problem List Decreased activity tolerance;Decreased balance;Decreased knowledge of use of DME;Cardiopulmonary status limiting activity;Obesity;Pain       PT Treatment Interventions DME instruction;Gait training;Stair training;Functional mobility training;Therapeutic activities;Therapeutic exercise;Balance training;Patient/family education    PT Goals (Current goals can be found in the Care Plan section)  Acute Rehab PT Goals Patient Stated Goal: return home PT Goal Formulation: With patient Time For Goal Achievement: 08/28/20 Potential to Achieve Goals: Good    Frequency Min 3X/week   Barriers to discharge        Co-evaluation               AM-PAC PT "6 Clicks" Mobility  Outcome Measure Help needed turning from your back to your side while in a flat bed without using bedrails?: A Little Help needed moving from lying on your back to sitting on the side of a flat bed without using bedrails?: A Little Help needed moving to and from a bed to a chair (including a wheelchair)?: A Little Help needed standing up from a chair using your arms (e.g., wheelchair or bedside chair)?: A Little Help needed to walk in hospital  room?: A Little Help needed climbing 3-5 steps with a railing? : A Lot 6 Click Score: 17    End of Session   Activity Tolerance: Patient tolerated treatment well;Patient limited by pain Patient left: in chair;with call bell/phone within reach Nurse Communication: Mobility status PT Visit Diagnosis: Other abnormalities of gait and mobility (R26.89);Pain Pain - Right/Left: Left Pain - part of body: Hip    Time: [id number] PT Time Calculation (min) (ACUTE ONLY): 23 min   Charges:   PT Evaluation $PT Eval Low Complexity: 1 Low           Tori Xavian Hardcastle PT, DPT 08/14/20, 12:23 PM

## 2020-08-14 NOTE — Progress Notes (Signed)
Alert and oriented x 4. C/o surgical site pain. Pain medication given and went to sleep. HD cath dressing clean dry and intact. Pt needs standby assist with transfers. Call bell within reach. BSC at bedside. Voids in commode and sometimes bathroom. Purewick in use as well. Continue to monitor. Marland Kitchen

## 2020-08-14 NOTE — Progress Notes (Signed)
PROGRESS NOTE    Julie [name]  [id number] DOB: Feb 07, 1951 DOA: 08/08/2020 PCP: Rosita Fire, MD   Brief Narrative:   Julie Underkoffler Adamsis a 71 y.o.femalewith medical history significant forCKD 5, OSA, diabetes mellitus, hypothyroidism, systolic CHF, hypertension and obstructive sleep apnea. Patient presented to the ED with complaints of leg swelling extending up to her thighs and difficulty breathing. Patient saw her nephrologistDr. Alm Bustard the day of admission and was referred to the ED.she has had tunneled hemodialysis catheter placed on 4/25 with hemodialysis started at that time.  Nephrology is continuing dialysis today and tomorrow.  PT evaluation pending with potential need for placement given left hip pain and potential difficulty with ambulation.  Patient will be eligible for discharge once dialysis chair has been obtained.  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)   A-V fistula (HCC)   End stage renal disease (HCC)   Acute on chronic systolic congestive heart failure decompensation-resolved -Repeat 2D echocardiogram with LVEF 40% which is similar to prior at approximately 45% on 10/21 -LV dysfunction again noted -No further need for IV Lasix as patient is on dialysis -Continue to monitor strict I's and O's as well as daily weights  ESRD on HD, new start this admission -Severe volume overload as noted above -Tunneled catheter placement on 4/25 and initiation of hemodialysis, patient to receive hemodialysis today and tomorrow -Patient has left upper extremity fistula that was recently placed on 4/15 per patient -[place] of BLE negative for DVT -Patient will need new hemodialysis chair arranged prior  to discharge  Atrial fibrillation -On Eliquis at home which has not been resumed -Continue metoprolol -Telemetry monitoring and IV metoprolol as needed for episodes of RVR  Left hip pain -Pelvic x-ray with no acute abnormality -Likely sciatica versus bursitis -Continue oral pain medications as needed -PT evaluation  pending it may require potential home health PT versus placement -Typically ambulates with cane  Type 2 diabetes -Hemoglobin A1c 7.4% -Continue SSI -Resume home Ozempic  Chronic anemia -Currently at baseline -Likely anemia of chronic disease related to CKD -Aranesp on 4/22 per Nephrology  Hypertension-stable -Continue home metoprolol  Morbid obesity with OSA -Lifestyle changes outpatient -CPAP at bedtime   DVT prophylaxis:Eliquis resumed 4/26 Code Status:Full Family Communication:None at bedside, pt will call family Disposition Plan: Status is: Inpatient  Remains inpatient appropriate because:IV treatments appropriate due to intensity of illness or inability to take PO and Inpatient level of care appropriate due to severity of illness   Dispo: The patient is from:Home Anticipated d/c is [id number] Patient currently is not medically stable to d/c. Difficult to place patient No  Consultants:  Nephrology  General Surgery  Procedures:  See below  Antimicrobials:  None  Subjective: Patient seen and evaluated today and states that "I am not feeling like myself today."  She denies any other pain.  No other acute events noted overnight.  Objective: Vitals:   08/13/20 1643 08/13/20 2043 08/14/20 0439 08/14/20 0600  BP: 128/74 138/86 126/68   Pulse: 70 72 76   Resp: '18 18 20   '$ Temp:  97.8 F (36.6 C) 97.7 F (36.5 C)   TempSrc:   Oral   SpO2: 100% 100% 99%   Weight: Marland Kitchen)  166 kg   (!) 165.2 kg  Height:        Intake/Output Summary (Last 24 hours) at 08/14/2020 1212 Last data filed  at 08/14/2020 0500 Gross per 24 hour  Intake 300 ml  Output 2250 ml  Net -1950 ml   Filed Weights   08/13/20 1440 08/13/20 1643 08/14/20 0600  Weight: (!) 166.7 kg (!) 166 kg (!) 165.2 kg    Examination:  General exam: Appears calm and comfortable, morbidly obese Respiratory system: Clear to auscultation. Respiratory effort normal. Cardiovascular system: S1 & S2 heard, RRR.  Gastrointestinal system: Abdomen is soft Central nervous system: Alert and awake Extremities: No edema Skin: No significant lesions noted Psychiatry: Flat affect.    Data Reviewed: I have personally reviewed following labs and imaging studies  CBC: Recent Labs  Lab 08/08/20 1305 08/10/20 0514 08/13/20 0444  WBC 10.6* 7.7 5.7  HGB 9.7* 8.8* 9.1*  HCT 32.2* 28.3* 29.8*  MCV 95.8 95.0 94.0  PLT 245 243 [id number]   Basic Metabolic Panel: Recent Labs  Lab 08/10/20 0514 08/11/20 0446 08/12/20 0609 08/13/20 0444 08/14/20 0708  NA 137 138 136 139 139  K 3.8 3.8 3.7 3.8 4.0  CL 99 100 99 100 100  CO2 '26 28 27 27 27  '$ GLUCOSE 158* 146* 207* 162* 186*  BUN 120* 118* 117* 112* 77*  CREATININE 3.69* 3.55* 3.47* 3.25* 2.88*  CALCIUM 7.9* 8.2* 8.4* 8.8* 8.8*  MG  --  2.1 2.0  --  1.9  PHOS  --   --   --   --  3.7   GFR: Estimated Creatinine Clearance: 30.4 mL/min (A) (by C-G formula based on SCr of 2.88 mg/dL (H)). Liver Function Tests: Recent Labs  Lab 08/14/20 0708  ALBUMIN 3.3*   No results for input(s): LIPASE, AMYLASE in the last 168 hours. No results for input(s): AMMONIA in the last 168 hours. Coagulation Profile: No results for input(s): INR, PROTIME in the last 168 hours. Cardiac Enzymes: No results for input(s): CKTOTAL, CKMB, CKMBINDEX, TROPONINI in the last 168 hours. BNP (last 3 results) No results for input(s): PROBNP in the last 8760 hours. HbA1C: No results for input(s): HGBA1C in the last 72 hours. CBG: Recent Labs  Lab 08/13/20 1332 08/13/20 1641 08/13/20 2044  08/14/20 0726 08/14/20 1050  GLUCAP 122* 121* 192* 167* 213*   Lipid Profile: No results for input(s): CHOL, HDL, LDLCALC, TRIG, CHOLHDL, LDLDIRECT in the last 72 hours. Thyroid Function Tests: No results for input(s): TSH, T4TOTAL, FREET4, T3FREE, THYROIDAB in the last 72 hours. Anemia Panel: No results for input(s): VITAMINB12, FOLATE, FERRITIN, TIBC, IRON, RETICCTPCT in the last 72 hours. Sepsis Labs: No results for input(s): PROCALCITON, LATICACIDVEN in the last 168 hours.  Recent Results (from the past 240 hour(s))  Resp Panel by RT-PCR (Flu A&B, Covid) Nasopharyngeal Swab     Status: None   Collection Time: 08/08/20  4:30 PM   Specimen: Nasopharyngeal Swab; Nasopharyngeal(NP) swabs in vial transport medium  Result Value Ref Range Status   SARS Coronavirus 2 by RT PCR NEGATIVE NEGATIVE Final    Comment: (NOTE) SARS-CoV-2 target nucleic acids are NOT DETECTED.  The SARS-CoV-2 RNA is generally detectable in upper respiratory specimens during the acute phase of infection. The lowest concentration of SARS-CoV-2 viral copies this assay can detect is 138 copies/mL. A negative result does not preclude SARS-Cov-2 infection and should not be used as the sole basis for treatment or other patient management decisions. A negative result may  occur with  improper specimen collection/handling, submission of specimen other than nasopharyngeal swab, presence of viral mutation(s) within the areas targeted by this assay, and inadequate number of viral copies(<138 copies/mL). A negative result must be combined with clinical observations, patient history, and epidemiological information. The expected result is Negative.  Fact Sheet for Patients:  EntrepreneurPulse.com.au  Fact Sheet for Healthcare Providers:  IncredibleEmployment.be  This test is no t yet approved or cleared by the [place] FDA and  has been authorized for detection and/or diagnosis  of SARS-CoV-2 by FDA under an Emergency Use Authorization (EUA). This EUA will remain  in effect (meaning this test can be used) for the duration of the COVID-19 declaration under Section 564(b)(1) of the Act, 21 U.S.C.section 360bbb-3(b)(1), unless the authorization is terminated  or revoked sooner.       Influenza A by PCR NEGATIVE NEGATIVE Final   Influenza B by PCR NEGATIVE NEGATIVE Final    Comment: (NOTE) The Xpert Xpress SARS-CoV-2/FLU/RSV plus assay is intended as an aid in the diagnosis of influenza from Nasopharyngeal swab specimens and should not be used as a sole basis for treatment. Nasal washings and aspirates are unacceptable for Xpert Xpress SARS-CoV-2/FLU/RSV testing.  Fact Sheet for Patients: EntrepreneurPulse.com.au  Fact Sheet for Healthcare Providers: IncredibleEmployment.be  This test is not yet approved or cleared by the [place] FDA and has been authorized for detection and/or diagnosis of SARS-CoV-2 by FDA under an Emergency Use Authorization (EUA). This EUA will remain in effect (meaning this test can be used) for the duration of the COVID-19 declaration under Section 564(b)(1) of the Act, 21 U.S.C. section 360bbb-3(b)(1), unless the authorization is terminated or revoked.  Performed at [facility], [address], [place], [place] [address]   Surgical pcr screen     Status: Abnormal   Collection Time: 08/13/20  7:45 AM   Specimen: Nasal Mucosa; Nasal Swab  Result Value Ref Range Status   MRSA, PCR NEGATIVE NEGATIVE Final   Staphylococcus aureus POSITIVE (A) NEGATIVE Final    Comment: RESULT CALLED TO, READ BACK BY AND VERIFIED WITH: WATSON,K AT [id number] BY HUFFINES,S ON 08/13/20. (NOTE) The Xpert SA Assay (FDA approved for NASAL specimens in patients 72 years of age and older), is one component of a comprehensive surveillance program. It is not intended to diagnose infection nor to guide or monitor  treatment. Performed at [name], [address] [place], [place] [address]          Radiology Studies: [facility] Chest [facility] 1 View  Result Date: 08/13/2020 CLINICAL DATA:  Vascular dialysis catheter placement EXAM: PORTABLE CHEST 1 VIEW COMPARISON:  08/08/2020 chest radiograph. FINDINGS: Right internal jugular central venous catheter terminates over the cavoatrial junction. Stable cardiomediastinal silhouette with mild to moderate cardiomegaly. No pneumothorax. No pleural effusion. Mild-to-moderate pulmonary edema. Surgical clips overlie the lower right chest and bilateral axilla. IMPRESSION: Well-positioned right internal jugular central venous catheter. No pneumothorax. Mild-to-moderate congestive heart failure. Electronically Signed   By: Ilona Sorrel M.D.   On: 08/13/2020 13:09   DG C-Arm 1-60 Min-No Report  Result Date: 08/13/2020 Fluoroscopy was utilized by the requesting physician.  No radiographic interpretation.        Scheduled Meds: . apixaban  5 mg Oral BID  . calcitRIOL  0.25 mcg Oral Daily  . chlorhexidine  15 mL Mouth/Throat Once   Or  . mouth rinse  15 mL Mouth Rinse Once  . Chlorhexidine Gluconate Cloth  6 each Topical Q0600  .  gabapentin  100 mg Oral TID  . insulin aspart  0-5 Units Subcutaneous QHS  . insulin aspart  0-9 Units Subcutaneous TID WC  . insulin glargine  15 Units Subcutaneous QHS  . lipase/protease/amylase  12,000 Units Oral BID  . metoprolol succinate  50 mg Oral BID  . mupirocin ointment  1 application Nasal BID  . pantoprazole  40 mg Oral Daily  . Semaglutide(0.25 or 0.'5MG'$ /DOS)  0.25 mg Subcutaneous Once per day on Mon Thu   Continuous Infusions: . sodium chloride    . sodium chloride    . anticoagulant sodium citrate    . furosemide Stopped (08/13/20 2045)  . lactated ringers       LOS: 6 days    Time spent: 35 minutes    Dacota Devall Darleen Crocker, DO [facility]  If 7PM-7AM, please contact  night-coverage [email or web address] 08/14/2020, 12:12 PM

## 2020-08-15 DIAGNOSIS — I770 Arteriovenous fistula, acquired: Secondary | ICD-10-CM

## 2020-08-15 DIAGNOSIS — N186 End stage renal disease: Secondary | ICD-10-CM

## 2020-08-15 LAB — CBC
HCT: 29.6 % — ABNORMAL LOW (ref 36.0–46.0)
Hemoglobin: 9 g/dL — ABNORMAL LOW (ref 12.0–15.0)
MCH: 28.9 pg (ref 26.0–34.0)
MCHC: 30.4 g/dL (ref 30.0–36.0)
MCV: 95.2 fL (ref 80.0–100.0)
Platelets: 173 10*3/uL (ref 150–400)
RBC: 3.11 MIL/uL — ABNORMAL LOW (ref 3.87–5.11)
RDW: 13.4 % (ref 11.5–15.5)
WBC: 6.2 10*3/uL (ref 4.0–10.5)
nRBC: 0 % (ref 0.0–0.2)

## 2020-08-15 LAB — IRON AND TIBC
Iron: 37 ug/dL (ref 28–170)
Saturation Ratios: 16 % (ref 10.4–31.8)
TIBC: 235 ug/dL — ABNORMAL LOW (ref 250–450)
UIBC: 198 ug/dL

## 2020-08-15 LAB — RENAL FUNCTION PANEL
Albumin: 3 g/dL — ABNORMAL LOW (ref 3.5–5.0)
Anion gap: 10 (ref 5–15)
BUN: 56 mg/dL — ABNORMAL HIGH (ref 8–23)
CO2: 29 mmol/L (ref 22–32)
Calcium: 8.7 mg/dL — ABNORMAL LOW (ref 8.9–10.3)
Chloride: 102 mmol/L (ref 98–111)
Creatinine, Ser: 2.44 mg/dL — ABNORMAL HIGH (ref 0.44–1.00)
GFR, Estimated: 21 mL/min — ABNORMAL LOW (ref >60)
Glucose, Bld: 108 mg/dL — ABNORMAL HIGH (ref 70–99)
Phosphorus: 3 mg/dL (ref 2.5–4.6)
Potassium: 3.6 mmol/L (ref 3.5–5.1)
Sodium: 141 mmol/L (ref 135–145)

## 2020-08-15 LAB — GLUCOSE, CAPILLARY
Glucose-Capillary: 103 mg/dL — ABNORMAL HIGH (ref 70–99)
Glucose-Capillary: 122 mg/dL — ABNORMAL HIGH (ref 70–99)
Glucose-Capillary: 170 mg/dL — ABNORMAL HIGH (ref 70–99)

## 2020-08-15 LAB — FERRITIN: Ferritin: 102 ng/mL (ref 11–307)

## 2020-08-15 LAB — MAGNESIUM: Magnesium: 1.8 mg/dL (ref 1.7–2.4)

## 2020-08-15 LAB — HEPATITIS B SURFACE ANTIGEN: Hepatitis B Surface Ag: NONREACTIVE

## 2020-08-15 MED ORDER — CYCLOBENZAPRINE HCL 5 MG PO TABS: 5.0000 mg | ORAL_TABLET | Freq: Three times a day (TID) | ORAL | 0 refills | Status: AC | PRN

## 2020-08-15 MED ORDER — CYCLOBENZAPRINE HCL 10 MG PO TABS
5.0000 mg | ORAL_TABLET | Freq: Three times a day (TID) | ORAL | Status: DC
  Administered 2020-08-15: 5 mg via ORAL
  Filled 2020-08-15 (×2): qty 1

## 2020-08-15 MED ORDER — HEPARIN SODIUM (PORCINE) 1000 UNIT/ML IJ SOLN
3800.0000 [IU] | Freq: Once | INTRAMUSCULAR | Status: AC
  Administered 2020-08-15: 3800 [IU]
  Filled 2020-08-15: qty 4

## 2020-08-15 NOTE — Discharge Summary (Signed)
Physician Discharge Summary  Julie [name] [id number] DOB: [birth year] DOA: 08/08/2020  PCP: Rosita Fire, MD  Admit date: 08/08/2020 Discharge date: 08/15/2020  Admitted From: Home Disposition: Home  Recommendations for Outpatient Follow-up:  1. Follow up with PCP in 1-2 weeks 2. Please obtain BMP/CBC in one week 3. Patient has been set up with outpatient dialysis  Home Health: Home health RN, PT Equipment/Devices: Rolling walker with seat  Discharge Condition: Stable CODE STATUS: Full code Diet recommendation: Heart healthy  Brief/Interim Summary: Julie [name] a 70 y.o.femalewith medical history significant forCKD 5, OSA, diabetes mellitus, hypothyroidism, chronic systolic CHF, hypertension and obstructive sleep apnea. Patient presented to the ED with complaints of leg swelling extending up to her thighs and difficulty breathing. Patient saw her nephrologistDr. Alm Bustard the day of admission and was referred to the ED.she has had tunneled hemodialysis catheter placed on 4/25 with hemodialysis started at that time.    It was felt that she had likely progressed to end-stage renal disease and she was set up with outpatient dialysis center for chronic dialysis.   She was seen by physical therapy and has elected to go home with home health services.  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)   A-V fistula (HCC)   End stage renal disease (HCC)  Acute on chronic systolic congestive heart failure decompensation-resolved -Repeat 2D echocardiogram with LVEF 40% which is similar to prior at approximately 45% on 10/21 -LV dysfunction again noted -No further  need for IV Lasix as patient is on dialysis -Continue to monitor strict I's and O's as well as daily weights  ESRD on HD, new start this admission -Severe volume overload as noted above -Tunneled catheter placement on 4/25 and initiation of hemodialysis -She received dialysis treatments on 4/25, 4/26 and 4/27 -Patient has left upper extremity fistula that was recently placed on 4/15 per patient -[place] of BLE negative for DVT -She has been set up with outpatient dialysis for Monday, Wednesday, Friday treatments  Atrial fibrillation -Continue on Eliquis -Continue metoprolol -Heart rate stable  Left hip pain -Pelvic x-ray with no acute abnormality -Likely sciatica versus bursitis -Continue oral pain medications as needed -Seen by PT, patient elects to go home with home health services -Rolling walker with seat has been ordered  Type 2 diabetes -Hemoglobin A1c 7.4% -Continue SSI -Resumehome Ozempic  Chronic anemia -Currently at baseline -Likely anemia of chronic disease related to CKD -Aranesp on 4/22 per Nephrology  Hypertension-stable -Continue home metoprolol  Morbid obesity with OSA -Lifestyle changes outpatient -CPAP at bedtime  Discharge Instructions  Discharge Instructions    Diet - low sodium heart healthy   Complete by: As directed    Increase activity slowly   Complete by: As directed    No wound care   Complete by: As directed      Allergies as of 08/15/2020      Reactions   Ace Inhibitors    Lortab [hydrocodone-acetaminophen]       Medication List    STOP taking these medications   metoprolol tartrate 25 MG tablet Commonly known as: LOPRESSOR   predniSONE 5 MG tablet Commonly known as: DELTASONE   torsemide 100 MG  tablet Commonly known as: DEMADEX   torsemide 20 MG tablet Commonly known as: DEMADEX     TAKE these medications   aspirin EC 81 MG tablet Take 81 mg by mouth daily.   calcitRIOL 0.25 MCG capsule Commonly known as:  ROCALTROL Take 0.25 mcg by mouth daily.   cholecalciferol 25 MCG (1000 UNIT) tablet Commonly known as: VITAMIN D3 Take 1,000 Units by mouth daily.   cyclobenzaprine 5 MG tablet Commonly known as: FLEXERIL Take 1 tablet (5 mg total) by mouth 3 (three) times daily as needed for muscle spasms.   Eliquis 5 MG Tabs tablet Generic drug: apixaban Take 1 tablet by mouth 2 (two) times daily.   gabapentin 100 MG capsule Commonly known as: NEURONTIN Take 100 mg by mouth 3 (three) times daily.   hydrOXYzine 25 MG tablet Commonly known as: ATARAX/VISTARIL Take 25 mg by mouth daily as needed.   insulin aspart 100 UNIT/ML injection Commonly known as: novoLOG Inject 10 Units into the skin 3 (three) times daily before meals. Sliding scale   lipase/protease/amylase 12000-38000 units Cpep capsule Commonly known as: CREON Take 10,440 Units by mouth 2 (two) times daily.   meclizine 12.5 MG tablet Commonly known as: ANTIVERT Take 12.5 mg by mouth every 6 (six) hours as needed.   metoprolol succinate 50 MG 24 hr tablet Commonly known as: TOPROL-XL Take 50 mg by mouth 2 (two) times daily.   Ozempic (0.25 or 0.5 MG/DOSE) 2 MG/1.5ML Sopn Generic drug: Semaglutide(0.25 or 0.'5MG'$ /DOS) Inject 0.25 mg into the skin 2 (two) times a week.   pantoprazole 40 MG tablet Commonly known as: PROTONIX Take 40 mg by mouth daily.   sodium bicarbonate 650 MG tablet Take 650 mg by mouth daily as needed.   traMADol 50 MG tablet Commonly known as: ULTRAM Take 50 mg by mouth every 6 (six) hours as needed.   Tyler Aas FlexTouch 100 UNIT/ML FlexTouch Pen Generic drug: insulin degludec Inject 22 Units into the skin at bedtime.            Durable Medical Equipment  (From admission, onward)         Start     Ordered   08/15/20 1605  For home use only DME 4 wheeled rolling walker with seat  Once       Comments: Bariatric rollator  Question:  Patient needs a walker to treat with the following condition   Answer:  Generalized weakness   08/15/20 1604          Follow-up Information    Rosita Fire, MD In 2 days.   Specialty: Internal Medicine Contact information: [place] [address] [phone]        [facility].   Specialty: Emergency Medicine Why: If symptoms worsen Contact information: [address] [id number] [place], [place] Follow up.   Why: [facility] staff will call you to schedule visits starting early next week Contact information: [place] [id number] [phone]        [facility] [place]. Go on 08/17/2020.   Why: Please arrive at 12:00PM on Friday 08/17/20 for your first treatment and to complete paperwork. Please bring your insurance card, pharmacy insurance card, medications, and a blanket in case you get cold.  Contact information: [address] [place], [place]  [phone]             Allergies  Allergen Reactions  .  Ace Inhibitors   . Lortab [Hydrocodone-Acetaminophen]     Consultations:  Nephrology   Procedures/Studies: CT Chest Wo Contrast  Result Date: 08/08/2020 CLINICAL DATA:  Abnormal x-ray EXAM: CT CHEST WITHOUT CONTRAST TECHNIQUE: Multidetector CT imaging of the chest was performed following the standard protocol without IV contrast. COMPARISON:  Chest x-ray today. FINDINGS: Cardiovascular: Cardiomegaly. Dense mitral valve annular calcifications. Scattered aortic calcifications. No aneurysm. Mediastinum/Nodes: No mediastinal, hilar, or axillary adenopathy. Trachea and esophagus are unremarkable. Lungs/Pleura: No confluent opacities or effusions. Mild vascular congestion. Upper Abdomen: Prior cholecystectomy. Enlarged left hepatic lobe nodular contours suggesting cirrhosis. Musculoskeletal: Chest wall soft tissues are unremarkable. No acute bony abnormality. IMPRESSION: Cardiomegaly, vascular  congestion. No confluent airspace opacities or effusions. Appearance of the liver suggests cirrhosis. Recommend clinical correlation. Electronically Signed   By: Rolm Baptise M.D.   On: 08/08/2020 15:46   US Venous Img Lower Bilateral (DVT)  Result Date: 08/08/2020 CLINICAL DATA:  Shortness of breath. EXAM: BILATERAL LOWER EXTREMITY VENOUS DOPPLER ULTRASOUND TECHNIQUE: Gray-scale sonography with graded compression, as well as color Doppler and duplex ultrasound were performed to evaluate the lower extremity deep venous systems from the level of the common femoral vein and including the common femoral, femoral, profunda femoral, popliteal and calf veins including the posterior tibial, peroneal and gastrocnemius veins when visible. The superficial great saphenous vein was also interrogated. Spectral Doppler was utilized to evaluate flow at rest and with distal augmentation maneuvers in the common femoral, femoral and popliteal veins. COMPARISON:  None. FINDINGS: RIGHT LOWER EXTREMITY Common Femoral Vein: No evidence of thrombus. Normal compressibility, respiratory phasicity and response to augmentation. Saphenofemoral Junction: No evidence of thrombus. Normal compressibility and flow on color Doppler imaging. Profunda Femoral Vein: No evidence of thrombus. Normal compressibility and flow on color Doppler imaging. Femoral Vein: No evidence of thrombus, although the distal vein is not visualized. Visualized portions of the vein demonstrate compressibility, respiratory phasicity and response to augmentation. Popliteal Vein: No evidence of thrombus. Normal compressibility, respiratory phasicity and response to augmentation. Calf Veins: No evidence of thrombus in the visualized calf veins. The peroneal vein is not visualized. Normal compressibility and flow on color Doppler imaging of the visualized calf veins. Superficial Great Saphenous Vein: No evidence of thrombus. Normal compressibility. Venous Reflux:  None. LEFT  LOWER EXTREMITY Common Femoral Vein: No evidence of thrombus. Normal compressibility, respiratory phasicity and response to augmentation. Saphenofemoral Junction: No evidence of thrombus. Normal compressibility and flow on color Doppler imaging. Profunda Femoral Vein: No evidence of thrombus. Normal compressibility and flow on color Doppler imaging. Femoral Vein: No evidence of thrombus, although the distal vein is not visualized. Visualized portions of the vein demonstrate compressibility, respiratory phasicity and response to augmentation. Popliteal Vein: Not visualized. Calf Veins: No evidence of thrombus in the visualized calf veins. The peroneal vein is not visualized. Normal compressibility and flow on color Doppler imaging of the visualized calf veins. Superficial Great Saphenous Vein: No evidence of thrombus. Normal compressibility. Venous Reflux:  None. IMPRESSION: Significantly limited evaluation due to patient body habitus with multiple veins not visualized. Within this limitation, no evidence of DVT. Electronically Signed   By: Margaretha Sheffield MD   On: 08/08/2020 14:23   DG Chest Port 1 View  Result Date: 08/13/2020 CLINICAL DATA:  Vascular dialysis catheter placement EXAM: PORTABLE CHEST 1 VIEW COMPARISON:  08/08/2020 chest radiograph. FINDINGS: Right internal jugular central venous catheter terminates over the cavoatrial junction. Stable cardiomediastinal silhouette with mild to moderate cardiomegaly. No pneumothorax. No pleural  effusion. Mild-to-moderate pulmonary edema. Surgical clips overlie the lower right chest and bilateral axilla. IMPRESSION: Well-positioned right internal jugular central venous catheter. No pneumothorax. Mild-to-moderate congestive heart failure. Electronically Signed   By: Ilona Sorrel M.D.   On: 08/13/2020 13:09   DG Chest Port 1 View  Result Date: 08/08/2020 CLINICAL DATA:  Shortness of breath. EXAM: PORTABLE CHEST 1 VIEW COMPARISON:  Aug 31, 2019. FINDINGS: Stable  cardiomegaly. No pneumothorax is noted. Right lung is clear. Mild left basilar atelectasis or infiltrate is noted with possible associated effusion. Bony thorax is unremarkable. IMPRESSION: Left basilar opacity as described above. Electronically Signed   By: Marijo Conception M.D.   On: 08/08/2020 13:23   DG C-Arm 1-60 Min-No Report  Result Date: 08/13/2020 Fluoroscopy was utilized by the requesting physician.  No radiographic interpretation.   ECHOCARDIOGRAM COMPLETE  Result Date: 08/09/2020    ECHOCARDIOGRAM REPORT   Patient Name:   CERA PFOST Laskaris Date of Exam: 08/09/2020 Medical Rec #:  [id number]      Height:       68.0 in Accession #:    [id number]     Weight:       376.5 lb Date of Birth:  [birth year]       BSA:          2.674 m Patient Age:    70 years       BP:           120/80 mmHg Patient Gender: F              HR:           83 bpm. Exam Location:  [facility] Procedure: 2D Echo Indications:    CHF-Acute Systolic [id number]  History:        Patient has no prior history of Echocardiogram examinations.                 CHF; Risk Factors:Non-Smoker and Diabetes. AV Fistula, Breast                 Cancer.  Sonographer:    Leavy Cella RDCS (AE) Referring Phys: [place]  1. Left ventricular ejection fraction, by estimation, is 40%. The left ventricle has moderately decreased function. The left ventricle demonstrates global hypokinesis. There is mild left ventricular hypertrophy. Left ventricular diastolic parameters are  indeterminate.  2. Right ventricular systolic function is normal. The right ventricular size is normal.  3. Left atrial size was mildly dilated.  4. Right atrial size was mildly dilated.  5. Poorly visualized MR by color Doppler, by CW spectrum waveform would suggest at least moderate MR. The mitral valve is abnormal. Mild to moderate mitral valve regurgitation. No evidence of mitral stenosis.  6. The tricuspid valve is abnormal. Tricuspid valve regurgitation is mild  to moderate.  7. The aortic valve is tricuspid. There is mild calcification of the aortic valve. There is mild thickening of the aortic valve. Aortic valve regurgitation is not visualized. No aortic stenosis is present.  8. The inferior vena cava is dilated in size with >50% respiratory variability, suggesting right atrial pressure of 8 mmHg. FINDINGS  Left Ventricle: Left ventricular ejection fraction, by estimation, is 40%. The left ventricle has moderately decreased function. The left ventricle demonstrates global hypokinesis. The left ventricular internal cavity size was normal in size. There is mild left ventricular hypertrophy. Left ventricular diastolic parameters are indeterminate. Right Ventricle: The right ventricular size is normal. No increase in  right ventricular wall thickness. Right ventricular systolic function is normal. Left Atrium: Left atrial size was mildly dilated. Right Atrium: Right atrial size was mildly dilated. Pericardium: There is no evidence of pericardial effusion. Mitral Valve: Poorly visualized MR by color Doppler, by CW spectrum waveform would suggest at least moderate MR. The mitral valve is abnormal. There is mild thickening of the mitral valve leaflet(s). There is mild calcification of the mitral valve leaflet(s). Mild mitral annular calcification. Mild to moderate mitral valve regurgitation. No evidence of mitral valve stenosis. Tricuspid Valve: The tricuspid valve is abnormal. Tricuspid valve regurgitation is mild to moderate. No evidence of tricuspid stenosis. Aortic Valve: The aortic valve is tricuspid. There is mild calcification of the aortic valve. There is mild thickening of the aortic valve. There is mild aortic valve annular calcification. Aortic valve regurgitation is not visualized. No aortic stenosis  is present. Aortic valve mean gradient measures 4.5 mmHg. Aortic valve peak gradient measures 8.0 mmHg. Aortic valve area, by VTI measures 1.40 cm. Pulmonic Valve: The  pulmonic valve was not well visualized. Pulmonic valve regurgitation is mild. No evidence of pulmonic stenosis. Aorta: The aortic root is normal in size and structure. Pulmonary Artery: Moderate pulmonary HTN, PASP is 42 mmHg. Venous: The inferior vena cava is dilated in size with greater than 50% respiratory variability, suggesting right atrial pressure of 8 mmHg. IAS/Shunts: No atrial level shunt detected by color flow Doppler.  LEFT VENTRICLE PLAX 2D LVIDd:         4.63 cm  Diastology LVIDs:         3.64 cm  LV e' medial:    5.57 cm/s LV PW:         1.20 cm  LV E/e' medial:  28.2 LV IVS:        1.23 cm  LV e' lateral:   6.32 cm/s LVOT diam:     1.90 cm  LV E/e' lateral: 24.8 LV SV:         47 LV SV Index:   18 LVOT Area:     2.84 cm  RIGHT VENTRICLE TAPSE (M-mode): 1.8 cm LEFT ATRIUM              Index       RIGHT ATRIUM           Index LA diam:        5.00 cm  1.87 cm/m  RA Area:     25.90 cm LA Vol (A2C):   103.0 ml 38.51 ml/m RA Volume:   84.80 ml  31.71 ml/m LA Vol (A4C):   62.9 ml  23.52 ml/m LA Biplane Vol: 81.6 ml  30.51 ml/m  AORTIC VALVE AV Area (Vmax):    1.67 cm AV Area (Vmean):   1.43 cm AV Area (VTI):     1.40 cm AV Vmax:           141.60 cm/s AV Vmean:          102.397 cm/s AV VTI:            0.335 m AV Peak Grad:      8.0 mmHg AV Mean Grad:      4.5 mmHg LVOT Vmax:         83.65 cm/s LVOT Vmean:        51.588 cm/s LVOT VTI:          0.165 m LVOT/AV VTI ratio: 0.49  AORTA Ao Root diam: 2.70 cm MITRAL VALVE  TRICUSPID VALVE MV Area (PHT): 4.63 cm     TR Peak grad:   34.1 mmHg MV Decel Time: 164 msec     TR Vmax:        292.00 cm/s MR Peak grad: 94.9 mmHg MR Mean grad: 60.0 mmHg     SHUNTS MR Vmax:      487.00 cm/s   Systemic VTI:  0.17 m MR Vmean:     362.0 cm/s    Systemic Diam: 1.90 cm MV E velocity: 157.00 cm/s MV A velocity: 61.60 cm/s MV E/A ratio:  2.55 Carlyle Dolly MD Electronically signed by Carlyle Dolly MD Signature Date/Time: 08/09/2020/12:13:56 PM    Final     DG Hip Unilat W or Wo Pelvis 2-3 Views Left  Result Date: 08/08/2020 CLINICAL DATA:  Left hip pain.  No known injury. EXAM: DG HIP (WITH OR WITHOUT PELVIS) 2-3V LEFT COMPARISON:  None. FINDINGS: There is no evidence of hip fracture or dislocation. There is no evidence of arthropathy or other focal bone abnormality. IMPRESSION: Negative exam. Electronically Signed   By: Inge Rise M.D.   On: 08/08/2020 14:40       Subjective: Patient is feeling better in terms of her breathing, she does have some pain in her left flank area which is worse with movement  Discharge Exam: Vitals:   08/15/20 1200 08/15/20 1230 08/15/20 1304 08/15/20 1336  BP: 120/80 115/70 124/70 127/73  Pulse: 90 86 88 86  Resp: '18 18 18 17  '$ Temp:   98.3 F (36.8 C) 98.2 F (36.8 C)  TempSrc:   Oral Oral  SpO2:   100% 100%  Weight:   (!) 162 kg   Height:        General: Pt is alert, awake, not in acute distress Cardiovascular: RRR, S1/S2 +, no rubs, no gallops Respiratory: CTA bilaterally, no wheezing, no rhonchi Abdominal: Soft, NT, ND, bowel sounds + Extremities: no edema, no cyanosis    The results of significant diagnostics from this hospitalization (including imaging, microbiology, ancillary and laboratory) are listed below for reference.     Microbiology: Recent Results (from the past 240 hour(s))  Resp Panel by RT-PCR (Flu A&B, Covid) Nasopharyngeal Swab     Status: None   Collection Time: 08/08/20  4:30 PM   Specimen: Nasopharyngeal Swab; Nasopharyngeal(NP) swabs in vial transport medium  Result Value Ref Range Status   SARS Coronavirus 2 by RT PCR NEGATIVE NEGATIVE Final    Comment: (NOTE) SARS-CoV-2 target nucleic acids are NOT DETECTED.  The SARS-CoV-2 RNA is generally detectable in upper respiratory specimens during the acute phase of infection. The lowest concentration of SARS-CoV-2 viral copies this assay can detect is 138 copies/mL. A negative result does not preclude  SARS-Cov-2 infection and should not be used as the sole basis for treatment or other patient management decisions. A negative result may occur with  improper specimen collection/handling, submission of specimen other than nasopharyngeal swab, presence of viral mutation(s) within the areas targeted by this assay, and inadequate number of viral copies(<138 copies/mL). A negative result must be combined with clinical observations, patient history, and epidemiological information. The expected result is Negative.  Fact Sheet for Patients:  EntrepreneurPulse.com.au  Fact Sheet for Healthcare Providers:  IncredibleEmployment.be  This test is no t yet approved or cleared by the [place] FDA and  has been authorized for detection and/or diagnosis of SARS-CoV-2 by FDA under an Emergency Use Authorization (EUA). This EUA will remain  in effect (meaning this  test can be used) for the duration of the COVID-19 declaration under Section 564(b)(1) of the Act, 21 U.S.C.section 360bbb-3(b)(1), unless the authorization is terminated  or revoked sooner.       Influenza A by PCR NEGATIVE NEGATIVE Final   Influenza B by PCR NEGATIVE NEGATIVE Final    Comment: (NOTE) The Xpert Xpress SARS-CoV-2/FLU/RSV plus assay is intended as an aid in the diagnosis of influenza from Nasopharyngeal swab specimens and should not be used as a sole basis for treatment. Nasal washings and aspirates are unacceptable for Xpert Xpress SARS-CoV-2/FLU/RSV testing.  Fact Sheet for Patients: EntrepreneurPulse.com.au  Fact Sheet for Healthcare Providers: IncredibleEmployment.be  This test is not yet approved or cleared by the [place] FDA and has been authorized for detection and/or diagnosis of SARS-CoV-2 by FDA under an Emergency Use Authorization (EUA). This EUA will remain in effect (meaning this test can be used) for the duration of  the COVID-19 declaration under Section 564(b)(1) of the Act, 21 U.S.C. section 360bbb-3(b)(1), unless the authorization is terminated or revoked.  Performed at [facility], [address] [place], [place] [address]   Surgical pcr screen     Status: Abnormal   Collection Time: 08/13/20  7:45 AM   Specimen: Nasal Mucosa; Nasal Swab  Result Value Ref Range Status   MRSA, PCR NEGATIVE NEGATIVE Final   Staphylococcus aureus POSITIVE (A) NEGATIVE Final    Comment: RESULT CALLED TO, READ BACK BY AND VERIFIED WITH: WATSON,K AT [id number] BY HUFFINES,S ON 08/13/20. (NOTE) The Xpert SA Assay (FDA approved for NASAL specimens in patients 50 years of age and older), is one component of a comprehensive surveillance program. It is not intended to diagnose infection nor to guide or monitor treatment. Performed at [facility], [address], [place], [place] [address]      Labs: BNP (last 3 results) Recent Labs    08/08/20 1306  BNP [id number]*   Basic Metabolic Panel: Recent Labs  Lab 08/11/20 0446 08/12/20 0609 08/13/20 0444 08/14/20 0708 08/15/20 0532  NA 138 136 139 139 141  K 3.8 3.7 3.8 4.0 3.6  CL 100 99 100 100 102  CO2 '28 27 27 27 29  '$ GLUCOSE 146* 207* 162* 186* 108*  BUN 118* 117* 112* 77* 56*  CREATININE 3.55* 3.47* 3.25* 2.88* 2.44*  CALCIUM 8.2* 8.4* 8.8* 8.8* 8.7*  MG 2.1 2.0  --  1.9 1.8  PHOS  --   --   --  3.7 3.0   Liver Function Tests: Recent Labs  Lab 08/14/20 0708 08/15/20 0532  ALBUMIN 3.3* 3.0*   No results for input(s): LIPASE, AMYLASE in the last 168 hours. No results for input(s): AMMONIA in the last 168 hours. CBC: Recent Labs  Lab 08/10/20 0514 08/13/20 0444 08/15/20 0532  WBC 7.7 5.7 6.2  HGB 8.8* 9.1* 9.0*  HCT 28.3* 29.8* 29.6*  MCV 95.0 94.0 95.2  PLT 243 228 173   Cardiac Enzymes: No results for input(s): CKTOTAL, CKMB, CKMBINDEX, TROPONINI in the last 168 hours. BNP: Invalid input(s): POCBNP CBG: Recent Labs  Lab 08/14/20 1555  08/14/20 2115 08/15/20 0730 08/15/20 1127 08/15/20 1654  GLUCAP 190* 222* 103* 122* 170*   D-Dimer No results for input(s): DDIMER in the last 72 hours. Hgb A1c No results for input(s): HGBA1C in the last 72 hours. Lipid Profile No results for input(s): CHOL, HDL, LDLCALC, TRIG, CHOLHDL, LDLDIRECT in the last 72 hours. Thyroid function studies No results for input(s): TSH, T4TOTAL, T3FREE, THYROIDAB in  the last 72 hours.  Invalid input(s): FREET3 Anemia work up Recent Labs    08/15/20 0532  FERRITIN 102  TIBC 235*  IRON 37   Urinalysis No results found for: COLORURINE, APPEARANCEUR, LABSPEC, [place], GLUCOSEU, [place], [place], [place], PROTEINUR, UROBILINOGEN, NITRITE, LEUKOCYTESUR Sepsis Labs Invalid input(s): PROCALCITONIN,  WBC,  LACTICIDVEN Microbiology Recent Results (from the past 240 hour(s))  Resp Panel by RT-PCR (Flu A&B, Covid) Nasopharyngeal Swab     Status: None   Collection Time: 08/08/20  4:30 PM   Specimen: Nasopharyngeal Swab; Nasopharyngeal(NP) swabs in vial transport medium  Result Value Ref Range Status   SARS Coronavirus 2 by RT PCR NEGATIVE NEGATIVE Final    Comment: (NOTE) SARS-CoV-2 target nucleic acids are NOT DETECTED.  The SARS-CoV-2 RNA is generally detectable in upper respiratory specimens during the acute phase of infection. The lowest concentration of SARS-CoV-2 viral copies this assay can detect is 138 copies/mL. A negative result does not preclude SARS-Cov-2 infection and should not be used as the sole basis for treatment or other patient management decisions. A negative result may occur with  improper specimen collection/handling, submission of specimen other than nasopharyngeal swab, presence of viral mutation(s) within the areas targeted by this assay, and inadequate number of viral copies(<138 copies/mL). A negative result must be combined with clinical observations, patient history, and epidemiological information. The  expected result is Negative.  Fact Sheet for Patients:  EntrepreneurPulse.com.au  Fact Sheet for Healthcare Providers:  IncredibleEmployment.be  This test is no t yet approved or cleared by the [place] FDA and  has been authorized for detection and/or diagnosis of SARS-CoV-2 by FDA under an Emergency Use Authorization (EUA). This EUA will remain  in effect (meaning this test can be used) for the duration of the COVID-19 declaration under Section 564(b)(1) of the Act, 21 U.S.C.section 360bbb-3(b)(1), unless the authorization is terminated  or revoked sooner.       Influenza A by PCR NEGATIVE NEGATIVE Final   Influenza B by PCR NEGATIVE NEGATIVE Final    Comment: (NOTE) The Xpert Xpress SARS-CoV-2/FLU/RSV plus assay is intended as an aid in the diagnosis of influenza from Nasopharyngeal swab specimens and should not be used as a sole basis for treatment. Nasal washings and aspirates are unacceptable for Xpert Xpress SARS-CoV-2/FLU/RSV testing.  Fact Sheet for Patients: EntrepreneurPulse.com.au  Fact Sheet for Healthcare Providers: IncredibleEmployment.be  This test is not yet approved or cleared by the [place] FDA and has been authorized for detection and/or diagnosis of SARS-CoV-2 by FDA under an Emergency Use Authorization (EUA). This EUA will remain in effect (meaning this test can be used) for the duration of the COVID-19 declaration under Section 564(b)(1) of the Act, 21 U.S.C. section 360bbb-3(b)(1), unless the authorization is terminated or revoked.  Performed at [facility], [address] [place], [place] [address]   Surgical pcr screen     Status: Abnormal   Collection Time: 08/13/20  7:45 AM   Specimen: Nasal Mucosa; Nasal Swab  Result Value Ref Range Status   MRSA, PCR NEGATIVE NEGATIVE Final   Staphylococcus aureus POSITIVE (A) NEGATIVE Final    Comment: RESULT CALLED TO,  READ BACK BY AND VERIFIED WITH: WATSON,K AT [id number] BY HUFFINES,S ON 08/13/20. (NOTE) The Xpert SA Assay (FDA approved for NASAL specimens in patients 62 years of age and older), is one component of a comprehensive surveillance program. It is not intended to diagnose infection nor to guide or monitor treatment. Performed at [facility], [place]  [address] [place], [place] [address]      Time coordinating discharge: 24mns  SIGNED:   JKathie Dike MD  [facility] 08/15/2020, 5:20 PM   If 7PM-7AM, please contact night-coverage [email or web address]

## 2020-08-15 NOTE — Care Management Important Message (Signed)
Important Message  Patient Details  Name: Julie [name] MRN: [id number] Date of Birth: 1950-09-02   Medicare Important Message Given:  Yes     [name] 08/15/2020, 2:27 PM

## 2020-08-15 NOTE — Progress Notes (Signed)
Inpatient Diabetes Program Recommendations  AACE/ADA: New Consensus Statement on Inpatient Glycemic Control (2015)  Target Ranges:  Prepandial:   less than 140 mg/dL      Peak postprandial:   less than 180 mg/dL (1-2 hours)      Critically ill patients:  140 - 180 mg/dL   Lab Results  Component Value Date   GLUCAP 103 (H) 08/15/2020   HGBA1C 7.2 (H) 08/11/2020    Review of Glycemic Control Results for Julie [name], Julie [name] (MRN [id number]) as of 08/15/2020 10:32  Ref. Range 08/14/2020 07:26 08/14/2020 10:50 08/14/2020 15:55 08/14/2020 21:15 08/15/2020 07:30  Glucose-Capillary Latest Ref Range: 70 - 99 mg/dL 167 (H)  Novolog 2 uits 213 (H)  Novolog 3 units 190 (H)  Novolog 2 units 222 (H)  Novolog 2 units 103 (H)   Diabetes history: DM 2 Outpatient Diabetes medications: Tresiba 22 units, Novolog 10 units tid, Ozempic 1 mg Mondays and Thursdays Current orders for Inpatient glycemic control:  Lantus 15 units qhs Novolog 0-9 units tid + hs Semaglutide on Mondays and Thursdays  BUN/Creat: 56/2.44  Inpatient Diabetes Program Recommendations:    -  Consider adding Novolog 2 units tid if pt eating 50% or more of meals.  Thanks, Tama Headings RN, MSN, BC-ADM Inpatient Diabetes Coordinator Team Pager [phone] (8a-5p)

## 2020-08-15 NOTE — Progress Notes (Signed)
Patient has declined use of CPAP for this evening,unit is on standby if needed.

## 2020-08-15 NOTE — TOC Transition Note (Signed)
Transition of Care [facility]) - CM/SW Discharge Note   Patient Details  Name: Julie [name] MRN: [id number] Date of Birth: 25-Dec-1950  [facility] CM/SW Contact:  Shade Flood, LCSW Phone Number: 08/15/2020, 5:13 PM   Clinical Narrative:     Pt medically stable for dc per MD. Damaris Schooner with pt earlier today to review dc planning. Pt reported that she plans to return home with son and HH at dc. Pt aware that she will have HD at [facility] in [place]. She reports that she will arrange to have someone take her until she feels she can drive herself.   Referred to [facility] as they are the only agency in pt's area that accept her insurance. Arranged Bariatric Rollator from [employer] and it will be delivered to pt's room today before dc.  Pt's chair time at [facility] is MWF at 12:45. First treatment will be Friday 08/17/20. Pt will need to be there at noon to complete paperwork. Added this information to pt's AVS along with documentation she needs to bring with her. Also updated pt verbally.  There are no other TOC needs identified for dc.  Final next level of care: [place] Barriers to Discharge: Barriers Resolved   Patient Goals and CMS Choice Patient states their goals for this hospitalization and ongoing recovery are:: Return home CMS Medicare.gov Compare Post Acute Care list provided to:: Patient Choice offered to / list presented to : Patient  Discharge Placement                       Discharge Plan and Services In-house Referral: Clinical Social Work Discharge Planning Services: CM Consult Post Acute Care Choice: NA          DME Arranged: Walker rolling with seat DME Agency: [employer] Date DME Agency Contacted: 08/15/20   Representative spoke with at DME Agency: Freda Munro [facility] Arranged: RN,PT [place] Agency: [facility] Date HH Agency Contacted: 08/15/20      Social Determinants of Health ([place]) Interventions     Readmission Risk  Interventions Readmission Risk Prevention Plan 08/15/2020 08/09/2020  Transportation Screening - Complete  Home Care Screening - Complete  Medication Review (RN CM) - Complete  HRI or Home Care Consult Complete -  Palliative Care Screening Not Applicable -  Medication Review (RN Care Manager) Complete -  Some recent data might be hidden

## 2020-08-15 NOTE — Progress Notes (Signed)
Patient ID: Julie [name], female   DOB: [birth year], 70 y.o.   MRN: [id number]  S: no acute events overnight. She reports that she did not sleep well due to itching (which is a chronic problem as per patient). Tolerated hd yesterday, net uf 1L. #3 treatment today  O:BP (!) 106/59 (BP Location: Right Arm)   Pulse 75   Temp (!) 97.5 F (36.4 C)   Resp 20   Ht '5\' 8"'$  (1.727 m)   Wt (!) 164.1 kg   SpO2 99%   BMI 55.01 kg/m   Intake/Output Summary (Last 24 hours) at 08/15/2020 0804 Last data filed at 08/15/2020 0700 Gross per 24 hour  Intake 680 ml  Output 1600 ml  Net -920 ml   Intake/Output: I/O last 3 completed shifts: In: 680 [P.O.:680] Out: 2900 [Urine:1900; Other:1000]  Intake/Output this shift:  No intake/output data recorded. Weight change: -1.5 kg Gen: NAD CVS: RRR Resp: no iwob Abd: obese, +BS, soft, NT/ND [id number] BLE edema Dialysis Access: lue avf +t/b, rij tdc c/d/i  Recent Labs  Lab 08/08/20 1305 08/09/20 0500 08/10/20 0514 08/11/20 0446 08/12/20 0609 08/13/20 0444 08/14/20 0708  NA 136 140 137 138 136 139 139  K 4.0 4.0 3.8 3.8 3.7 3.8 4.0  CL 98 102 99 100 99 100 100  CO2 '25 25 26 28 27 27 27  '$ GLUCOSE 131* 142* 158* 146* 207* 162* 186*  BUN 128* 120* 120* 118* 117* 112* 77*  CREATININE 4.40* 3.89* 3.69* 3.55* 3.47* 3.25* 2.88*  ALBUMIN  --   --   --   --   --   --  3.3*  CALCIUM 8.0* 8.2* 7.9* 8.2* 8.4* 8.8* 8.8*  PHOS  --   --   --   --   --   --  3.7   Liver Function Tests: Recent Labs  Lab 08/14/20 0708  ALBUMIN 3.3*   No results for input(s): LIPASE, AMYLASE in the last 168 hours. No results for input(s): AMMONIA in the last 168 hours. CBC: Recent Labs  Lab 08/08/20 1305 08/10/20 0514 08/13/20 0444 08/15/20 0532  WBC 10.6* 7.7 5.7 6.2  HGB 9.7* 8.8* 9.1* 9.0*  HCT 32.2* 28.3* 29.8* 29.6*  MCV 95.8 95.0 94.0 95.2  PLT 245 243 228 173   Cardiac Enzymes: No results for input(s): CKTOTAL, CKMB, CKMBINDEX, TROPONINI in the last 168  hours. CBG: Recent Labs  Lab 08/14/20 0726 08/14/20 1050 08/14/20 1555 08/14/20 2115 08/15/20 0730  GLUCAP 167* 213* 190* 222* 103*    Iron Studies: No results for input(s): IRON, TIBC, TRANSFERRIN, FERRITIN in the last 72 hours. Studies/Results: DG Chest Port 1 View  Result Date: 08/13/2020 CLINICAL DATA:  Vascular dialysis catheter placement EXAM: PORTABLE CHEST 1 VIEW COMPARISON:  08/08/2020 chest radiograph. FINDINGS: Right internal jugular central venous catheter terminates over the cavoatrial junction. Stable cardiomediastinal silhouette with mild to moderate cardiomegaly. No pneumothorax. No pleural effusion. Mild-to-moderate pulmonary edema. Surgical clips overlie the lower right chest and bilateral axilla. IMPRESSION: Well-positioned right internal jugular central venous catheter. No pneumothorax. Mild-to-moderate congestive heart failure. Electronically Signed   By: Ilona Sorrel M.D.   On: 08/13/2020 13:09   DG C-Arm 1-60 Min-No Report  Result Date: 08/13/2020 Fluoroscopy was utilized by the requesting physician.  No radiographic interpretation.   Marland Kitchen apixaban  5 mg Oral BID  . calcitRIOL  0.25 mcg Oral Daily  . chlorhexidine  15 mL Mouth/Throat Once   Or  . mouth rinse  15 mL  Mouth Rinse Once  . Chlorhexidine Gluconate Cloth  6 each Topical Q0600  . gabapentin  100 mg Oral TID  . insulin aspart  0-5 Units Subcutaneous QHS  . insulin aspart  0-9 Units Subcutaneous TID WC  . insulin glargine  15 Units Subcutaneous QHS  . lipase/protease/amylase  12,000 Units Oral BID  . metoprolol succinate  50 mg Oral BID  . mupirocin ointment  1 application Nasal BID  . pantoprazole  40 mg Oral Daily  . Semaglutide(0.25 or 0.'5MG'$ /DOS)  0.25 mg Subcutaneous Once per day on Mon Thu    BMET    Component Value Date/Time   NA 139 08/14/2020 0708   K 4.0 08/14/2020 0708   CL 100 08/14/2020 0708   CO2 27 08/14/2020 0708   GLUCOSE 186 (H) 08/14/2020 0708   BUN 77 (H) 08/14/2020 0708    CREATININE 2.88 (H) 08/14/2020 0708   CREATININE 4.65 (H) 08/31/2019 1445   CALCIUM 8.8 (L) 08/14/2020 0708   GFRNONAA 17 (L) 08/14/2020 0708   GFRNONAA 9 (L) 08/31/2019 1445   GFRAA 10 (L) 08/31/2019 1445   CBC    Component Value Date/Time   WBC 6.2 08/15/2020 0532   RBC 3.11 (L) 08/15/2020 0532   HGB 9.0 (L) 08/15/2020 0532   HCT 29.6 (L) 08/15/2020 0532   PLT 173 08/15/2020 0532   MCV 95.2 08/15/2020 0532   MCH 28.9 08/15/2020 0532   MCHC 30.4 08/15/2020 0532   RDW 13.4 08/15/2020 0532     Assessment/Plan:  1. New ESRD- s/p [facility] placement 4/25. HD started 4/25, #3 treatment today, next treatment tentatively planned for Friday, will maintain MWF thereafter (unless she gets a an outpatient chair with a different schedule) 1.  need outpatient HD to be arranged. 2. Acute on chronic systolic and diastolic CHF- will UF with HD 3. HTN - stable 4. A fib - per primary 5. Secondary HPTH - started on calcitriol and will follow Ca/phos 6. Anemia of CKD - started on ESA and follow iron stores 7. Vascular access - LUE AVF s/p revision recently at [facility].  TDC placement 4/25, appreciate assistance from surgery 8. Disposition - will need to have outpatient dialysis arranged prior to discharge.   Gean Quint, MD [facility]

## 2020-08-16 ENCOUNTER — Other Ambulatory Visit: Payer: Self-pay | Admitting: Internal Medicine

## 2020-08-16 DIAGNOSIS — I509 Heart failure, unspecified: Secondary | ICD-10-CM

## 2020-08-16 LAB — HEPATITIS B E ANTIBODY: Hep B E Ab: NEGATIVE

## 2021-01-29 ENCOUNTER — Ambulatory Visit (INDEPENDENT_AMBULATORY_CARE_PROVIDER_SITE_OTHER): Payer: Medicare Other | Admitting: Orthopaedic Surgery

## 2021-01-29 ENCOUNTER — Other Ambulatory Visit: Payer: Self-pay

## 2021-01-29 ENCOUNTER — Encounter: Payer: Self-pay | Admitting: Orthopaedic Surgery

## 2021-01-29 DIAGNOSIS — G8929 Other chronic pain: Secondary | ICD-10-CM | POA: Diagnosis not present

## 2021-01-29 DIAGNOSIS — N186 End stage renal disease: Secondary | ICD-10-CM

## 2021-01-29 DIAGNOSIS — M25562 Pain in left knee: Secondary | ICD-10-CM

## 2021-01-29 DIAGNOSIS — Z992 Dependence on renal dialysis: Secondary | ICD-10-CM

## 2021-01-29 DIAGNOSIS — Z6841 Body Mass Index (BMI) 40.0 and over, adult: Secondary | ICD-10-CM

## 2021-01-29 NOTE — Progress Notes (Signed)
PROCEDURE NOTE:  The patient requests injections of the left knee , verbal consent was obtained.  The left knee was prepped appropriately after time out was performed.   Sterile technique was observed and injection of 1 cc of Celestone 6 mg with several cc's of plain xylocaine. Anesthesia was provided by ethyl chloride and a 20-gauge needle was used to inject the knee area. The injection was tolerated well.  A band aid dressing was applied.  The patient was advised to apply ice later today and tomorrow to the injection sight as needed.   She uses cane.  She has effusion of the left knee today, ROM 0 to 105.  Encounter Diagnoses  Name Primary?   Chronic pain of left knee Yes   End stage renal disease on dialysis ([place])    Body mass index 45.0-49.9, adult ([place])    Morbid obesity ([place])    I will see as needed.  Call if any problem.  Precautions discussed.  Electronically Signed Sanjuana Kava, MD 10/11/20222:58 PM

## 2021-08-19 IMAGING — CT CT CHEST W/O CM
2 of 4 series · 15 of 36 positions shown, 18 images · non-contrast
Comparison: Chest x-ray today.

CLINICAL DATA: Abnormal x-ray

EXAM:
CT CHEST WITHOUT CONTRAST
TECHNIQUE: Multidetector CT imaging of the chest was performed following the
standard protocol without IV contrast.

[Series 2: routine chest without · axial · non-contrast · 0.88mm/px · z∈[+1194,+1486]mm · 12 of 174 slices shown, 15 images]
[im 14/174  mediastinal]
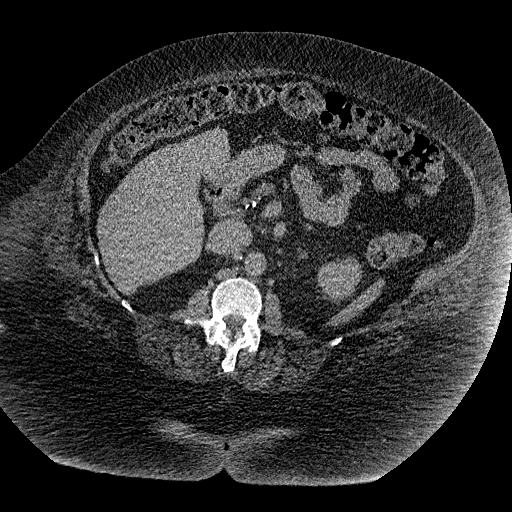
[im 14/174  lung]
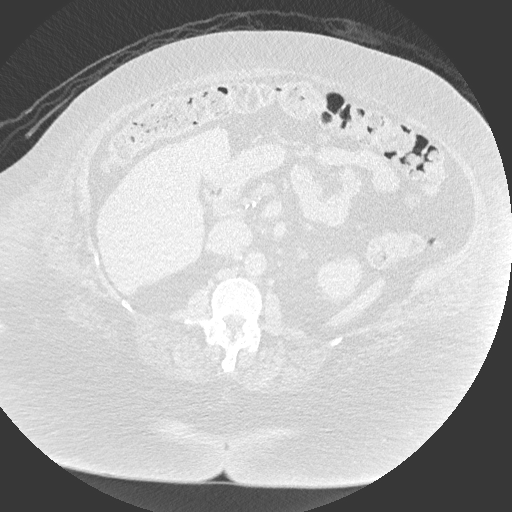
[im 27/174  lung]
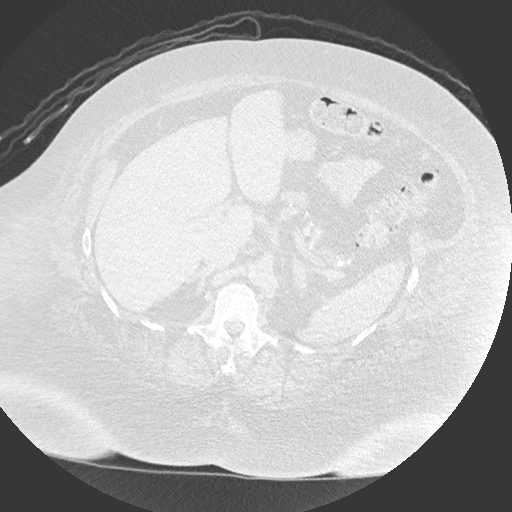
[im 40/174  lung]
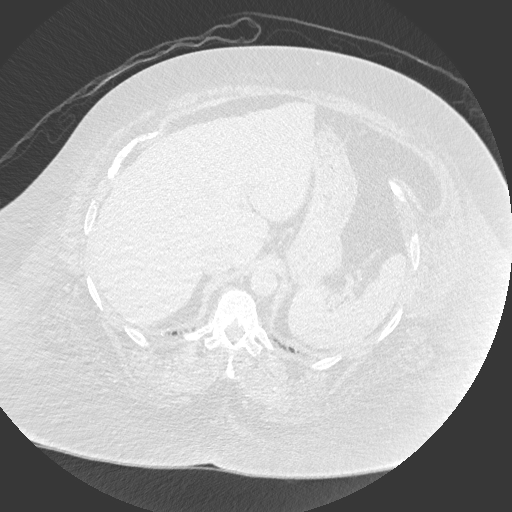
[im 54/174  lung]
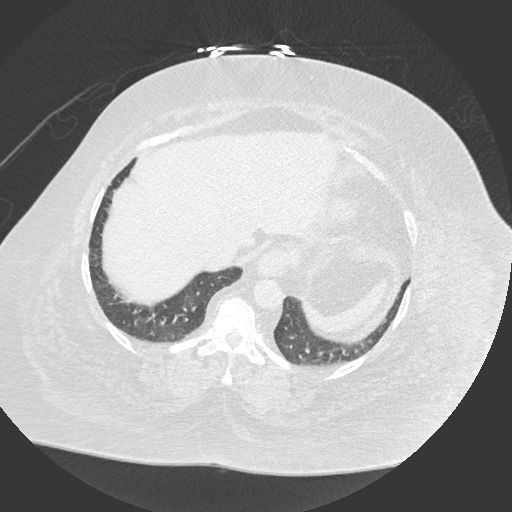
[im 67/174  mediastinal]
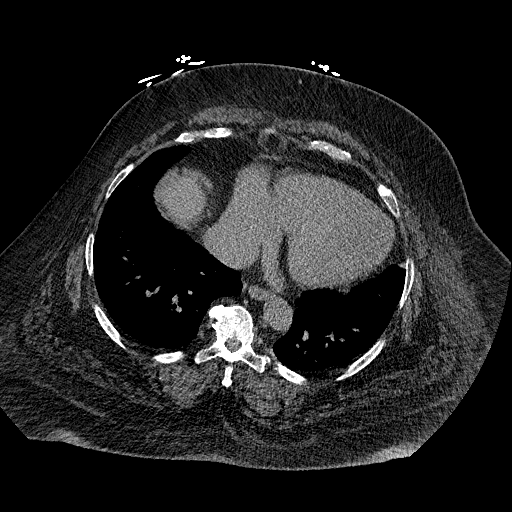
[im 67/174  lung]
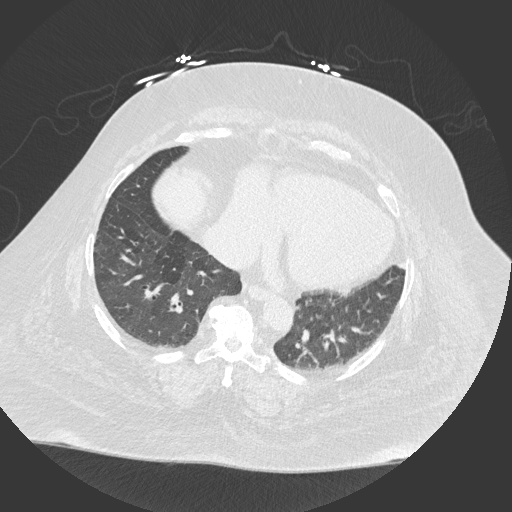
[im 80/174  lung]
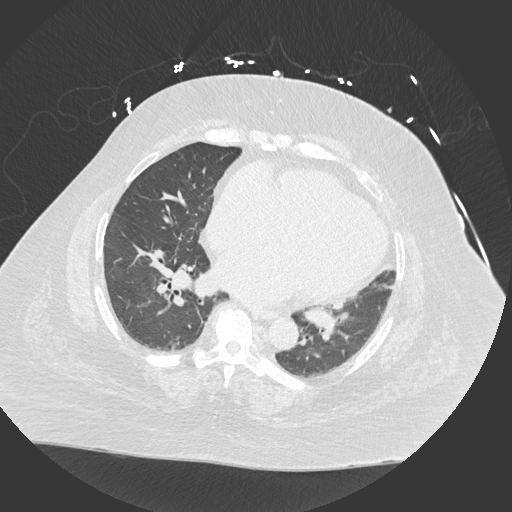
[im 94/174  lung]
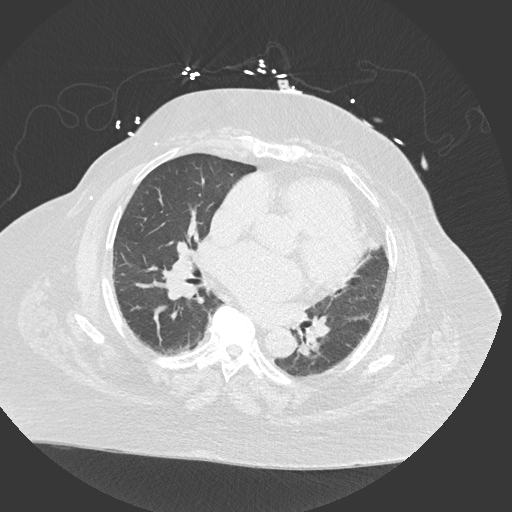
[im 107/174  lung]
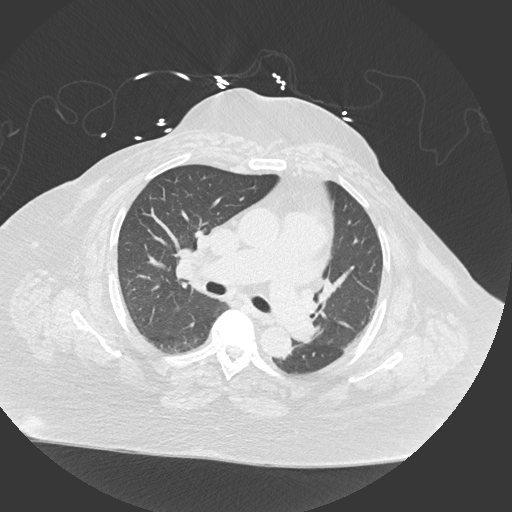
[im 120/174  mediastinal]
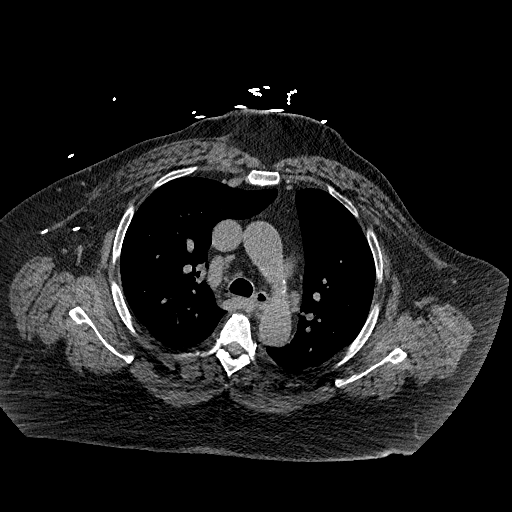
[im 120/174  lung]
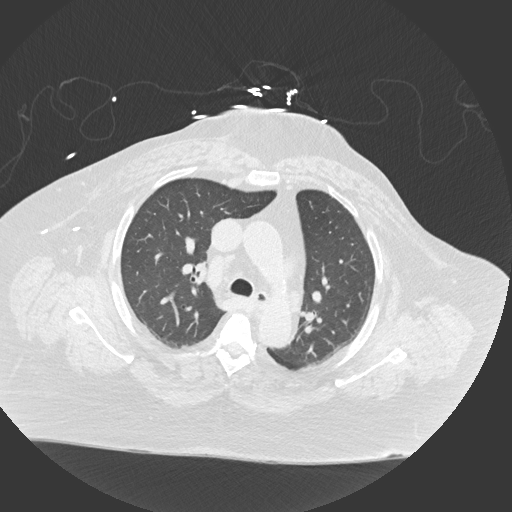
[im 134/174  lung]
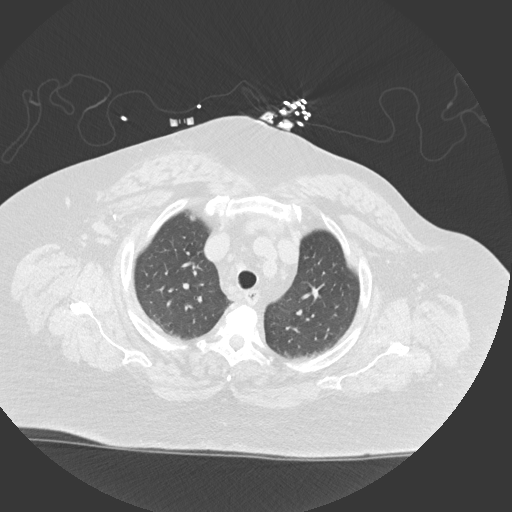
[im 147/174  lung]
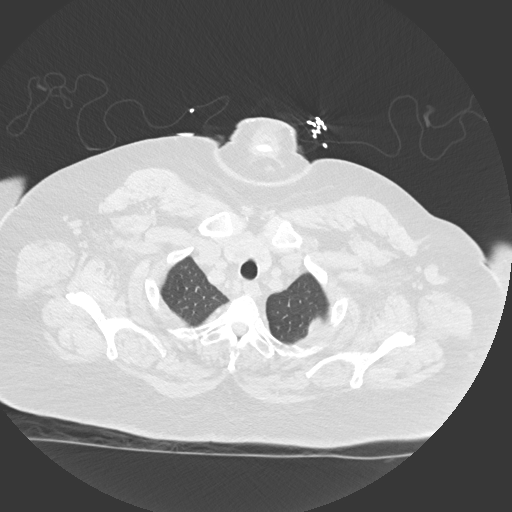
[im 160/174  lung]
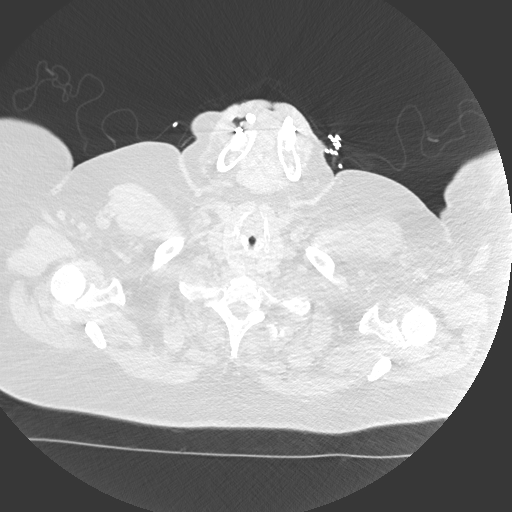

[Series 5: coronal · coronal · 0.71mm/px · 3 of 164 slices shown]
[im 33/164  lung]
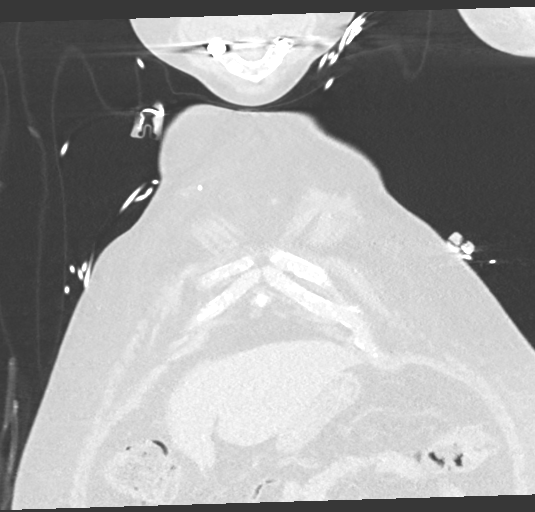
[im 66/164  lung]
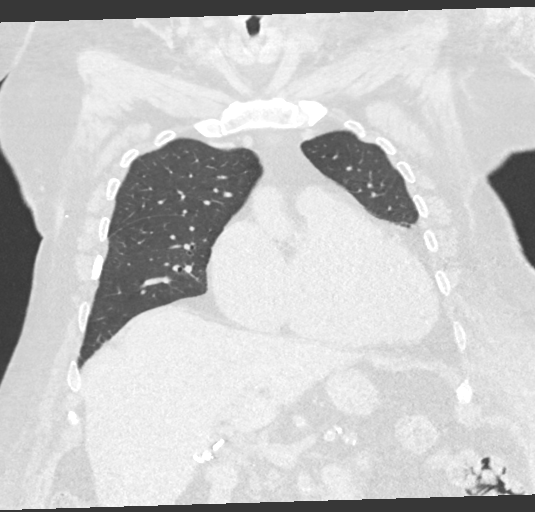
[im 98/164  lung]
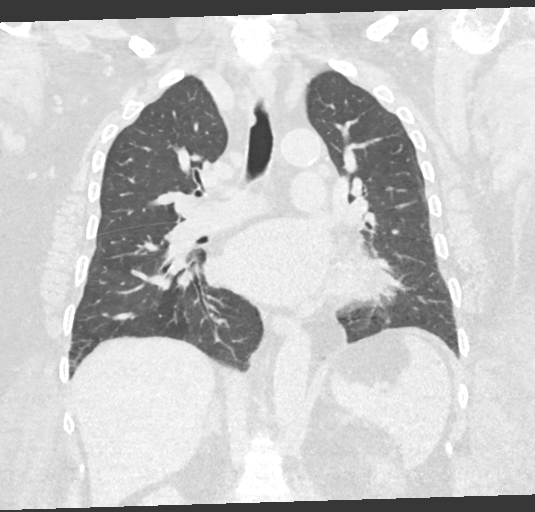

[15 of 36 positions shown; findings below may reference images not displayed]

FINDINGS: Cardiovascular: Cardiomegaly. Dense mitral valve annular
calcifications. Scattered aortic calcifications. No aneurysm.

Mediastinum/Nodes: No mediastinal, hilar, or axillary adenopathy.
Trachea and esophagus are unremarkable.

Lungs/Pleura: No confluent opacities or effusions. Mild vascular
congestion.

Upper Abdomen: Prior cholecystectomy. Enlarged left hepatic lobe
nodular contours suggesting cirrhosis.

Musculoskeletal: Chest wall soft tissues are unremarkable. No acute
bony abnormality.
IMPRESSION: Cardiomegaly, vascular congestion.

No confluent airspace opacities or effusions.

Appearance of the liver suggests cirrhosis. Recommend clinical
correlation.

## 2021-08-24 IMAGING — DX DG CHEST 1V PORT
1 series · 1 of 1 positions shown · non-contrast
Comparison: 08/08/2020 chest radiograph.

CLINICAL DATA: Vascular dialysis catheter placement

EXAM:
PORTABLE CHEST 1 VIEW

[chest ap]
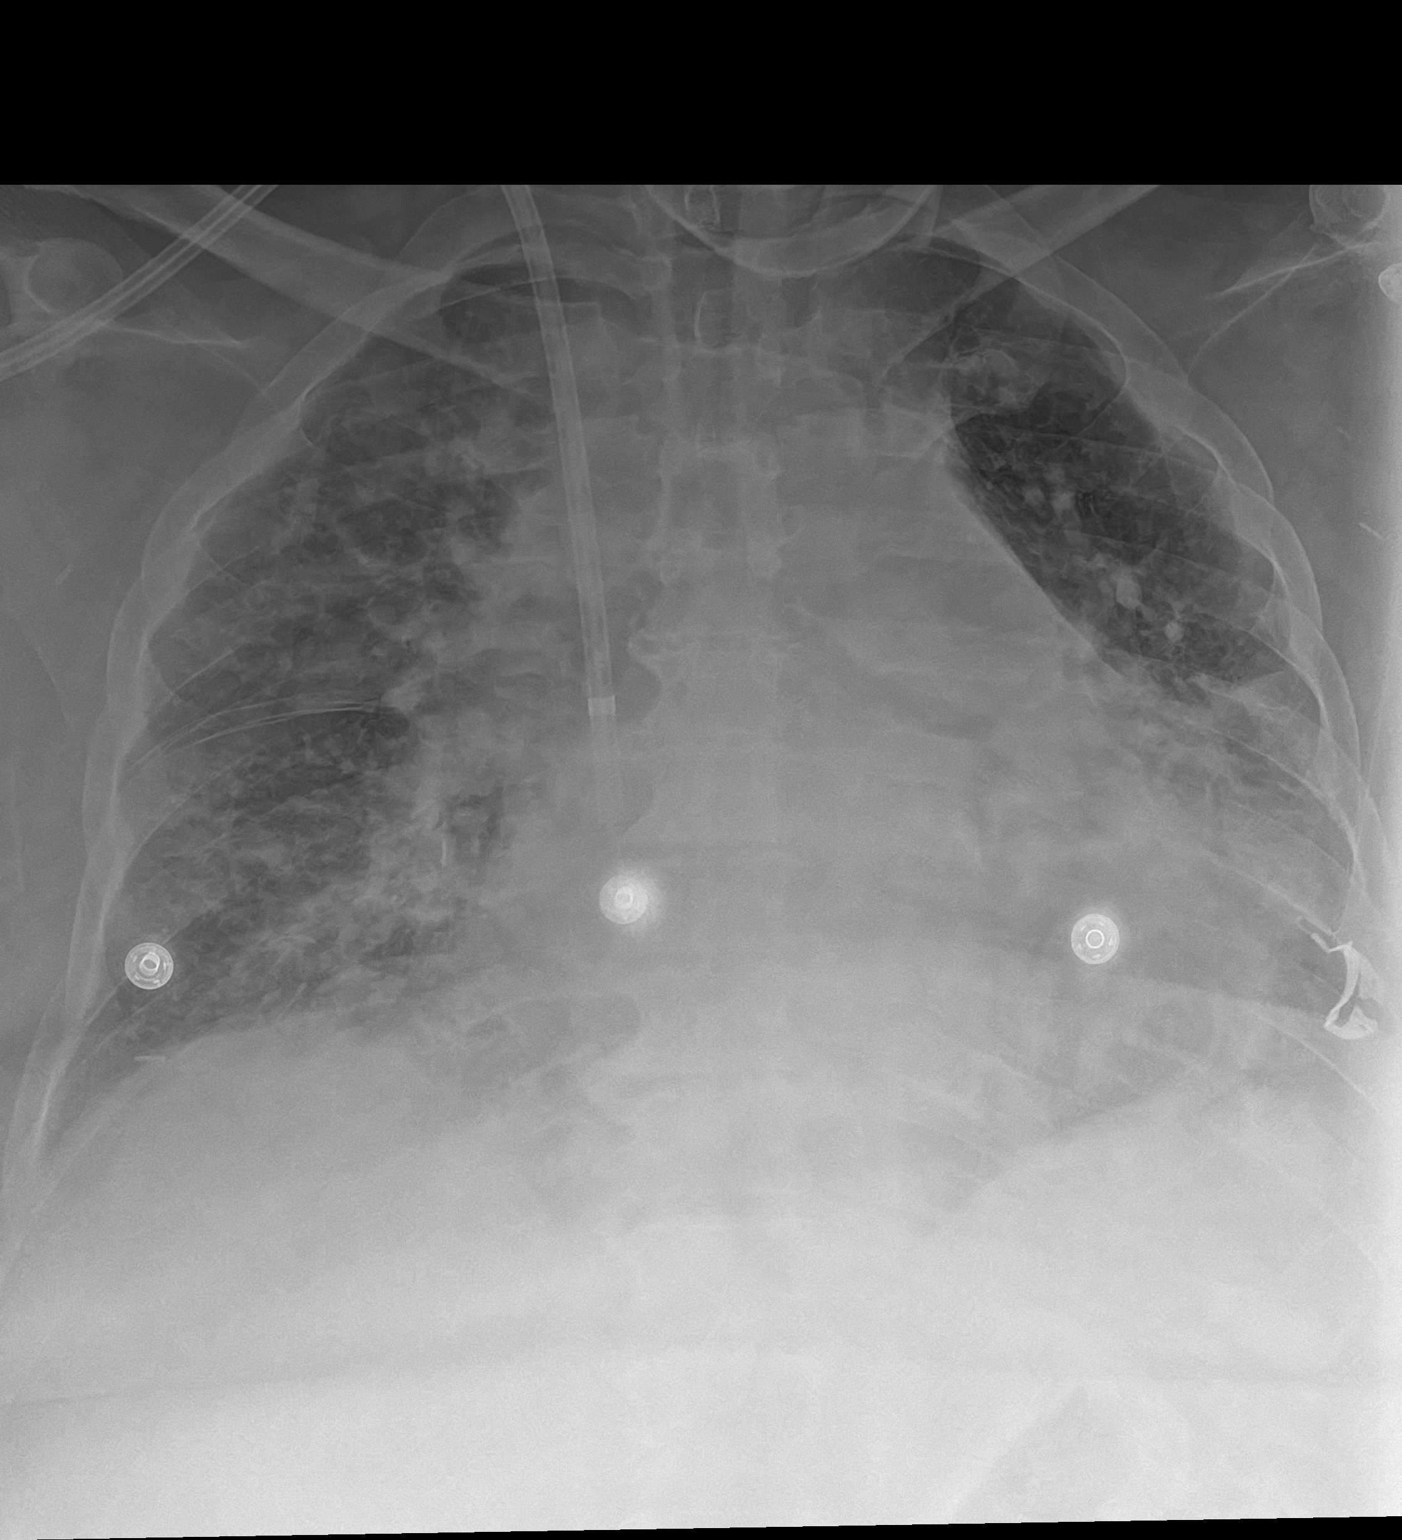

[1 of 1 positions shown; findings below may reference images not displayed]

FINDINGS: Right internal jugular central venous catheter terminates over the
cavoatrial junction. Stable cardiomediastinal silhouette with mild
to moderate cardiomegaly. No pneumothorax. No pleural effusion.
Mild-to-moderate pulmonary edema. Surgical clips overlie the lower
right chest and bilateral axilla.
IMPRESSION: Well-positioned right internal jugular central venous catheter. No
pneumothorax. Mild-to-moderate congestive heart failure.

## 2022-01-06 ENCOUNTER — Other Ambulatory Visit (HOSPITAL_COMMUNITY): Payer: Self-pay | Admitting: Gerontology

## 2022-01-06 DIAGNOSIS — W19XXXA Unspecified fall, initial encounter: Secondary | ICD-10-CM

## 2022-01-07 ENCOUNTER — Ambulatory Visit (HOSPITAL_COMMUNITY)
Admission: RE | Admit: 2022-01-07 | Discharge: 2022-01-07 | Disposition: A | Discharge status: Home / Self Care | Payer: Medicare Other | Source: Ambulatory Visit | Attending: Gerontology | Admitting: Gerontology

## 2022-01-07 DIAGNOSIS — M25511 Pain in right shoulder: Secondary | ICD-10-CM | POA: Insufficient documentation

## 2022-01-07 DIAGNOSIS — W19XXXA Unspecified fall, initial encounter: Secondary | ICD-10-CM | POA: Diagnosis not present

## 2022-01-07 DIAGNOSIS — H5711 Ocular pain, right eye: Secondary | ICD-10-CM | POA: Diagnosis not present

## 2022-06-19 ENCOUNTER — Encounter: Payer: Self-pay | Admitting: Radiology

## 2023-12-11 ENCOUNTER — Encounter: Payer: Self-pay | Admitting: Radiology

## 2024-02-22 ENCOUNTER — Encounter: Payer: Self-pay | Admitting: Radiology
